# Patient Record
Sex: Female | Born: 1975 | Hispanic: No | Marital: Single | Smoking: Current every day smoker
Health system: Southern US, Community
[De-identification: ages and names within clinical notes are randomized; demographics above are authoritative.]

## PROBLEM LIST (undated history)

## (undated) DIAGNOSIS — J45909 Unspecified asthma, uncomplicated: Secondary | ICD-10-CM

## (undated) DIAGNOSIS — F4310 Post-traumatic stress disorder, unspecified: Secondary | ICD-10-CM

## (undated) DIAGNOSIS — F32A Depression, unspecified: Secondary | ICD-10-CM

## (undated) DIAGNOSIS — F329 Major depressive disorder, single episode, unspecified: Secondary | ICD-10-CM

---

## 2000-12-16 ENCOUNTER — Ambulatory Visit (HOSPITAL_COMMUNITY): Admission: RE | Admit: 2000-12-16 | Discharge: 2000-12-16 | Payer: Self-pay | Admitting: General Surgery

## 2000-12-17 ENCOUNTER — Ambulatory Visit (HOSPITAL_COMMUNITY): Admission: RE | Admit: 2000-12-17 | Discharge: 2000-12-17 | Payer: Self-pay | Admitting: General Surgery

## 2000-12-17 ENCOUNTER — Encounter: Payer: Self-pay | Admitting: General Surgery

## 2001-03-23 ENCOUNTER — Inpatient Hospital Stay (HOSPITAL_COMMUNITY): Admission: EM | Admit: 2001-03-23 | Discharge: 2001-03-25 | Payer: Self-pay | Admitting: Emergency Medicine

## 2001-03-24 ENCOUNTER — Encounter: Payer: Self-pay | Admitting: *Deleted

## 2001-11-21 ENCOUNTER — Emergency Department (HOSPITAL_COMMUNITY): Admission: EM | Admit: 2001-11-21 | Discharge: 2001-11-21 | Payer: Self-pay | Admitting: Emergency Medicine

## 2001-11-21 ENCOUNTER — Encounter: Payer: Self-pay | Admitting: Emergency Medicine

## 2003-06-12 ENCOUNTER — Emergency Department (HOSPITAL_COMMUNITY): Admission: EM | Admit: 2003-06-12 | Discharge: 2003-06-13 | Payer: Self-pay | Admitting: Emergency Medicine

## 2005-09-15 ENCOUNTER — Inpatient Hospital Stay (HOSPITAL_COMMUNITY): Admission: AD | Admit: 2005-09-15 | Discharge: 2005-09-16 | Payer: Self-pay | Admitting: Family Medicine

## 2005-10-09 ENCOUNTER — Ambulatory Visit: Payer: Self-pay | Admitting: Obstetrics and Gynecology

## 2005-10-09 ENCOUNTER — Encounter (INDEPENDENT_AMBULATORY_CARE_PROVIDER_SITE_OTHER): Payer: Self-pay | Admitting: *Deleted

## 2007-04-04 ENCOUNTER — Emergency Department (HOSPITAL_COMMUNITY): Admission: EM | Admit: 2007-04-04 | Discharge: 2007-04-04 | Payer: Self-pay | Admitting: Emergency Medicine

## 2007-04-19 ENCOUNTER — Ambulatory Visit (HOSPITAL_COMMUNITY): Admission: RE | Admit: 2007-04-19 | Discharge: 2007-04-19 | Payer: Self-pay | Admitting: Orthopedic Surgery

## 2007-04-27 ENCOUNTER — Encounter: Admission: RE | Admit: 2007-04-27 | Discharge: 2007-07-26 | Payer: Self-pay | Admitting: Orthopedic Surgery

## 2007-11-30 ENCOUNTER — Inpatient Hospital Stay (HOSPITAL_COMMUNITY): Admission: AD | Admit: 2007-11-30 | Discharge: 2007-12-01 | Payer: Self-pay | Admitting: Obstetrics and Gynecology

## 2008-01-22 ENCOUNTER — Emergency Department (HOSPITAL_COMMUNITY): Admission: EM | Admit: 2008-01-22 | Discharge: 2008-01-23 | Payer: Self-pay | Admitting: Emergency Medicine

## 2008-06-07 ENCOUNTER — Inpatient Hospital Stay (HOSPITAL_COMMUNITY): Admission: AD | Admit: 2008-06-07 | Discharge: 2008-06-07 | Payer: Self-pay | Admitting: Obstetrics & Gynecology

## 2008-09-14 ENCOUNTER — Ambulatory Visit (HOSPITAL_COMMUNITY): Admission: RE | Admit: 2008-09-14 | Discharge: 2008-09-14 | Payer: Self-pay | Admitting: Orthopedic Surgery

## 2008-09-27 ENCOUNTER — Ambulatory Visit (HOSPITAL_COMMUNITY): Admission: RE | Admit: 2008-09-27 | Discharge: 2008-09-27 | Payer: Self-pay | Admitting: Orthopedic Surgery

## 2008-11-08 IMAGING — CR DG FOREARM 2V*L*
2 series · 2 of 2 positions shown · non-contrast
Comparison: none

HISTORY: Left arm injury, pain and swelling

LEFT FOREARM 2 VIEWS:
Spiral fracture distal left ulnar diaphysis.
Fracture is mildly displaced radial and volar.
No definite radial fracture identified.
Elbow and wrist joint alignments appear normal.

[x forearm ap left]
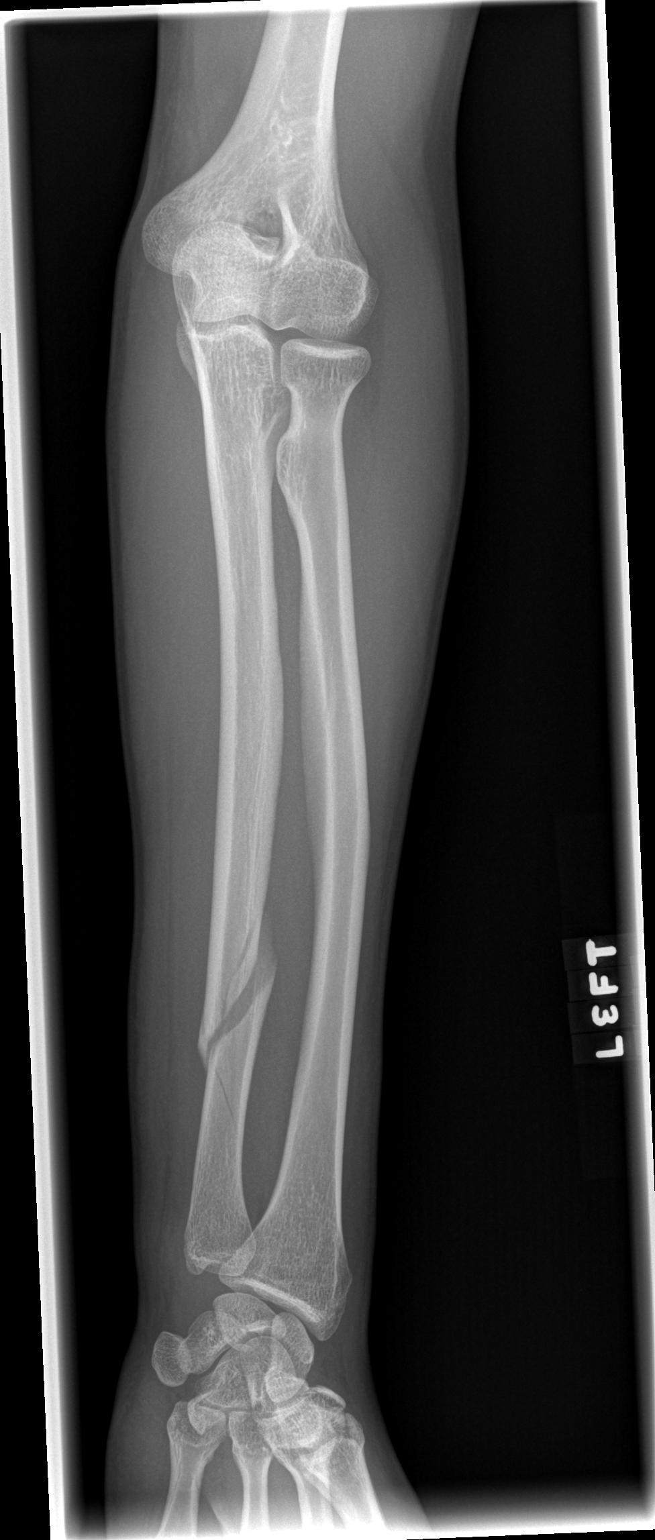

[x forearm lat left]
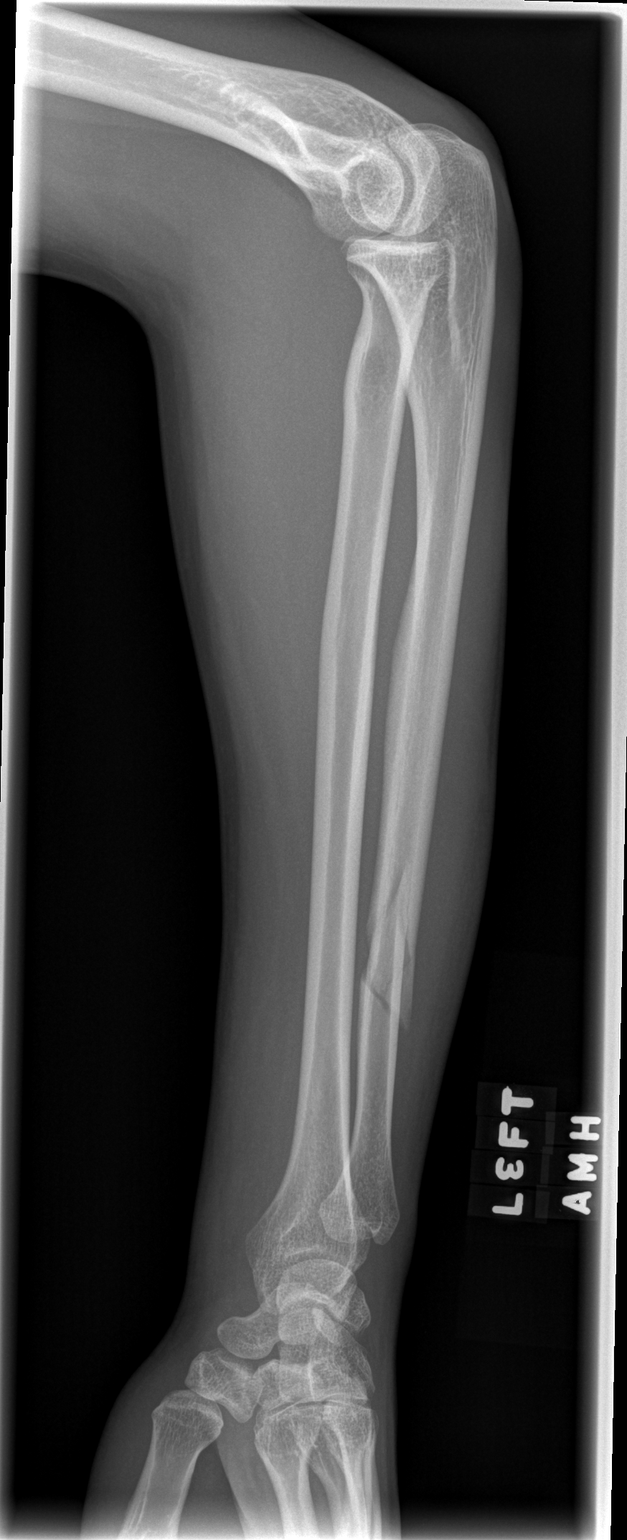

[2 of 2 positions shown; findings below may reference images not displayed]

IMPRESSION: Mildly displaced spiral fracture distal left ulnar diaphysis.

## 2009-01-29 ENCOUNTER — Emergency Department (HOSPITAL_COMMUNITY): Admission: EM | Admit: 2009-01-29 | Discharge: 2009-01-29 | Payer: Self-pay | Admitting: Emergency Medicine

## 2009-10-10 ENCOUNTER — Emergency Department (HOSPITAL_COMMUNITY): Admission: EM | Admit: 2009-10-10 | Discharge: 2009-10-10 | Payer: Self-pay | Admitting: Emergency Medicine

## 2009-12-21 ENCOUNTER — Emergency Department (HOSPITAL_COMMUNITY): Admission: EM | Admit: 2009-12-21 | Discharge: 2009-12-21 | Payer: Self-pay | Admitting: Emergency Medicine

## 2010-01-05 ENCOUNTER — Emergency Department (HOSPITAL_COMMUNITY): Admission: EM | Admit: 2010-01-05 | Discharge: 2010-01-05 | Payer: Self-pay | Admitting: Emergency Medicine

## 2010-05-03 ENCOUNTER — Emergency Department (HOSPITAL_COMMUNITY): Admission: EM | Admit: 2010-05-03 | Discharge: 2010-05-03 | Payer: Self-pay | Admitting: Emergency Medicine

## 2010-10-10 ENCOUNTER — Emergency Department (HOSPITAL_COMMUNITY)
Admission: EM | Admit: 2010-10-10 | Discharge: 2010-10-10 | Disposition: A | Discharge status: Home / Self Care | Payer: Self-pay | Attending: Emergency Medicine | Admitting: Emergency Medicine

## 2010-10-10 DIAGNOSIS — N898 Other specified noninflammatory disorders of vagina: Secondary | ICD-10-CM | POA: Insufficient documentation

## 2010-10-11 LAB — GC/CHLAMYDIA PROBE AMP, GENITAL
Chlamydia, DNA Probe: NEGATIVE
GC Probe Amp, Genital: NEGATIVE

## 2010-11-21 LAB — URINALYSIS, ROUTINE W REFLEX MICROSCOPIC
Ketones, ur: NEGATIVE mg/dL
Urobilinogen, UA: 0.2 mg/dL (ref 0.0–1.0)
pH: 5.5 (ref 5.0–8.0)

## 2010-11-21 LAB — URINE CULTURE
Colony Count: 4000
Culture  Setup Time: 201108270045

## 2010-11-21 LAB — GC/CHLAMYDIA PROBE AMP, GENITAL: Chlamydia, DNA Probe: POSITIVE — AB

## 2010-12-17 LAB — DIFFERENTIAL
Basophils Absolute: 0 10*3/uL (ref 0.0–0.1)
Basophils Relative: 0 % (ref 0–1)
Eosinophils Absolute: 0 10*3/uL (ref 0.0–0.7)
Eosinophils Relative: 0 % (ref 0–5)
Lymphocytes Relative: 16 % (ref 12–46)
Lymphs Abs: 1.8 10*3/uL (ref 0.7–4.0)
Monocytes Absolute: 0.7 10*3/uL (ref 0.1–1.0)
Monocytes Relative: 6 % (ref 3–12)
Neutro Abs: 8.6 10*3/uL — ABNORMAL HIGH (ref 1.7–7.7)
Neutrophils Relative %: 77 % (ref 43–77)

## 2010-12-17 LAB — CBC
HCT: 38.7 % (ref 36.0–46.0)
Hemoglobin: 13.4 g/dL (ref 12.0–15.0)
MCHC: 34.7 g/dL (ref 30.0–36.0)
MCV: 103.7 fL — ABNORMAL HIGH (ref 78.0–100.0)
Platelets: 219 10*3/uL (ref 150–400)
RBC: 3.73 MIL/uL — ABNORMAL LOW (ref 3.87–5.11)
RDW: 13.6 % (ref 11.5–15.5)
WBC: 11.2 10*3/uL — ABNORMAL HIGH (ref 4.0–10.5)

## 2010-12-17 LAB — BASIC METABOLIC PANEL
BUN: 2 mg/dL — ABNORMAL LOW (ref 6–23)
CO2: 25 mEq/L (ref 19–32)
Calcium: 9.1 mg/dL (ref 8.4–10.5)
Chloride: 104 mEq/L (ref 96–112)
Creatinine, Ser: 0.65 mg/dL (ref 0.4–1.2)
Potassium: 3.1 mEq/L — ABNORMAL LOW (ref 3.5–5.1)
Sodium: 138 mEq/L (ref 135–145)

## 2010-12-17 LAB — RAPID URINE DRUG SCREEN, HOSP PERFORMED
Amphetamines: POSITIVE — AB
Barbiturates: NOT DETECTED
Benzodiazepines: NOT DETECTED
Cocaine: POSITIVE — AB
Opiates: NOT DETECTED
Tetrahydrocannabinol: POSITIVE — AB

## 2010-12-17 LAB — MAGNESIUM: Magnesium: 2.1 mg/dL (ref 1.5–2.5)

## 2010-12-23 LAB — CBC
HCT: 42 % (ref 36.0–46.0)
HCT: 42.8 % (ref 36.0–46.0)
Hemoglobin: 14 g/dL (ref 12.0–15.0)
Hemoglobin: 14.4 g/dL (ref 12.0–15.0)
MCHC: 33.3 g/dL (ref 30.0–36.0)
MCHC: 33.7 g/dL (ref 30.0–36.0)
MCV: 104 fL — ABNORMAL HIGH (ref 78.0–100.0)
MCV: 103.3 fL — ABNORMAL HIGH (ref 78.0–100.0)
Platelets: 214 10*3/uL (ref 150–400)
Platelets: 214 10*3/uL (ref 150–400)
RBC: 4.04 MIL/uL (ref 3.87–5.11)
RBC: 4.15 MIL/uL (ref 3.87–5.11)
RDW: 13.8 % (ref 11.5–15.5)
RDW: 13.5 % (ref 11.5–15.5)
WBC: 9.6 10*3/uL (ref 4.0–10.5)
WBC: 8.5 10*3/uL (ref 4.0–10.5)

## 2011-01-08 ENCOUNTER — Emergency Department (HOSPITAL_COMMUNITY)
Admission: EM | Admit: 2011-01-08 | Discharge: 2011-01-08 | Disposition: A | Discharge status: Home / Self Care | Payer: Self-pay | Attending: Emergency Medicine | Admitting: Emergency Medicine

## 2011-01-08 DIAGNOSIS — Z049 Encounter for examination and observation for unspecified reason: Secondary | ICD-10-CM | POA: Insufficient documentation

## 2011-01-08 DIAGNOSIS — N898 Other specified noninflammatory disorders of vagina: Secondary | ICD-10-CM | POA: Insufficient documentation

## 2011-01-08 DIAGNOSIS — N949 Unspecified condition associated with female genital organs and menstrual cycle: Secondary | ICD-10-CM | POA: Insufficient documentation

## 2011-01-08 LAB — URINALYSIS, ROUTINE W REFLEX MICROSCOPIC
Bilirubin Urine: NEGATIVE
Glucose, UA: NEGATIVE mg/dL
Hgb urine dipstick: NEGATIVE
Nitrite: NEGATIVE
Specific Gravity, Urine: 1.024 (ref 1.005–1.030)
Urobilinogen, UA: 1 mg/dL (ref 0.0–1.0)

## 2011-01-08 LAB — WET PREP, GENITAL
WBC, Wet Prep HPF POC: NONE SEEN
Yeast Wet Prep HPF POC: NONE SEEN

## 2011-01-09 LAB — GC/CHLAMYDIA PROBE AMP, GENITAL: GC Probe Amp, Genital: NEGATIVE

## 2011-01-21 NOTE — Op Note (Signed)
Courtney Hansen, Courtney Hansen                 ACCOUNT NO.:  [id number]   MEDICAL RECORD NO.:  [id number]          PATIENT TYPE:  AMB   LOCATION:  SDS                          FACILITY:  [facility]   PHYSICIAN:  Artist Pais. Weingold, M.D.DATE OF BIRTH:  [birth year]   DATE OF PROCEDURE:  09/27/2008  DATE OF DISCHARGE:  09/27/2008                               OPERATIVE REPORT   PREOPERATIVE DIAGNOSIS:  Retained hardware, left ulna.   POSTOPERATIVE DIAGNOSIS:  Retained hardware, left ulna.   PROCEDURE:  Hardware removal, left ulna.   SURGEON:  Artist Pais. Mina Marble, MD   ASSISTANT:  None.   ANESTHESIA:  General.   TOURNIQUET TIME:  18 minutes.   COMPLICATIONS:  None.   DRAINS:  None.   OPERATIVE REPORT:  The patient taken to the operating suite.  After  induction of adequate general anesthesia, left upper extremity was  prepped and draped in sterile fashion.  An Esmarch was used to  exsanguinate the limb.  Tourniquet was then inflated to 250 mmHg.  Using  a previous incision on the subcutaneous for the ulna, mid to distal  third skin was incised for 6-8 cm.  Dissection was carried down to  interval between the ECU and FCU.  Dissection was carried down to the  six-hole compression plate.  This was carefully removed.  The wound was  irrigated, debrided of excess bone around the screw hole margins and  then loosely closed with 3-0 Prolene subcuticular stitch.  Steri-Strips,  4x4s fluffs, and a compressive dressing was applied as well as an ulnar  gutter splint.  The patient tolerated procedure well went to recovery  room in stable fashion.      Artist Pais Mina Marble, M.D.  Electronically Signed     MAW/MEDQ  D:  09/27/2008  T:  09/27/2008  Job:  [id number]

## 2011-01-21 NOTE — Op Note (Signed)
[name], Courtney Hansen                 ACCOUNT NO.:  [id number]   MEDICAL RECORD NO.:  [id number]          PATIENT TYPE:  AMB   LOCATION:  SDS                          FACILITY:  [facility]   PHYSICIAN:  Artist Pais. Weingold, M.D.DATE OF BIRTH:  [birth year]   DATE OF PROCEDURE:  04/19/2007  DATE OF DISCHARGE:                               OPERATIVE REPORT   PREOPERATIVE DIAGNOSIS:  Displaced left distal third ulna fracture.   POSTOPERATIVE DIAGNOSIS:  Displaced left distal third ulna fracture.   PROCEDURE:  Open reduction internal fixation of above using two 2.5-mm  lag screws followed by a 6-hole compression plate.   SURGEON:  Artist Pais. Mina Marble, M.D.   ASSISTANT:  None.   ANESTHESIA:  General.   TOURNIQUET TIME:  45 minutes.   COMPLICATIONS:  No complications.   DRAINS:  No drains.   OPERATIVE REPORT:  The patient was taken to the operating suite where  after the induction of adequate general anesthesia the left upper  extremity was prepped and draped in sterile fashion.  An Esmarch was  used to exsanguinate limb.  Tourniquet was then inflated to 250 mmHg.  At this point in time a longitudinal incision was made over the  subcutaneous border of the ulna, left side distal to mid third.  Skin  was incised.  The interval between the ECU and FCU was developed.  Dissection was carried down to the fracture site.  The fracture site was  debrided of clot.  Reduction was performed with 2 reduction clamps.  Intraoperative fluoroscopy revealed near anatomic reduction on both the  AP lateral and oblique view.  Two 2.5-mm lag screws were placed through  the large butterfly fragment and into the main shaft fragment securing  the fracture fixation.  A 6-hole compression plate was then used in  buttress fashion to neutralize and stabilize the plate using an  additional 4 cortical screws above and below the fracture site.  Intraoperative x-rays showed adequate reduction on both the AP lateral  and  oblique view.  The wound was irrigated and closed in layers with 2-0  Vicryl and a 3-0 Prolene subcuticular stitch on the skin.  Steri-Strips,  4x4s, Fluffs and an ulnar gutter splint were applied.  The patient  tolerated the procedure well.      Artist Pais Mina Marble, M.D.  Electronically Signed    MAW/MEDQ  D:  04/19/2007  T:  04/19/2007  Job:  [id number]

## 2011-01-24 NOTE — Group Therapy Note (Signed)
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  She went in to the [facility] recently for  a month episode of dysfunctional uterine bleeding and was placed on Ortho  Tri-Cyclen, which seemed to work satisfactorily.  She just came off her  first pack and has not had a Pap smear in over a year, so that will be done.  In addition to that, she is in good health with no significant complaints  and her hematocrit was 37+ at the [facility]U when she was there a few days ago.   PHYSICAL GENITAL EXAMINATION:  The abdomen is soft, flat, nontender, no  masses and no organomegaly.  External genitalia normal.  BUS within normal  limits.  The vagina is clean with a moderate amount of blood in the vault.  The cervix is clean and parous.  The uterus is anterior and normal size,  shape and consistency, and the adnexa is normal.   A Pap smear was taken.  The patient was given six samples of Ortho Tri-  Cyclen Lo and we have discussed with her the possibility that she may want  to consider a Mirena IUD, which she is going to consider for the future.           ______________________________  Argentina Donovan, MD     PR/MEDQ  D:  10/09/2005  T:  10/09/2005  Job:  [id number]

## 2011-04-19 ENCOUNTER — Emergency Department (HOSPITAL_COMMUNITY)
Admission: EM | Admit: 2011-04-19 | Discharge: 2011-04-19 | Disposition: A | Discharge status: Home / Self Care | Payer: Self-pay | Attending: Emergency Medicine | Admitting: Emergency Medicine

## 2011-04-19 ENCOUNTER — Emergency Department (HOSPITAL_COMMUNITY): Payer: Self-pay

## 2011-04-19 DIAGNOSIS — R220 Localized swelling, mass and lump, head: Secondary | ICD-10-CM | POA: Insufficient documentation

## 2011-04-19 DIAGNOSIS — IMO0002 Reserved for concepts with insufficient information to code with codable children: Secondary | ICD-10-CM | POA: Insufficient documentation

## 2011-06-02 LAB — CBC
RBC: 4.12
WBC: 11.7 — ABNORMAL HIGH

## 2011-06-02 LAB — WET PREP, GENITAL: Trich, Wet Prep: NONE SEEN

## 2011-06-09 LAB — URINE MICROSCOPIC-ADD ON

## 2011-06-09 LAB — WET PREP, GENITAL: Trich, Wet Prep: NONE SEEN

## 2011-06-09 LAB — POCT PREGNANCY, URINE: Preg Test, Ur: NEGATIVE

## 2011-06-09 LAB — URINALYSIS, ROUTINE W REFLEX MICROSCOPIC
Glucose, UA: NEGATIVE
Leukocytes, UA: NEGATIVE
pH: 5.5

## 2011-06-23 LAB — CBC
HCT: 40.9
Hemoglobin: 14
MCHC: 34.2
MCV: 103.2 — ABNORMAL HIGH
Platelets: 245
RBC: 3.97
RDW: 13.3
WBC: 8.6

## 2011-08-20 ENCOUNTER — Emergency Department (HOSPITAL_COMMUNITY)
Admission: EM | Admit: 2011-08-20 | Discharge: 2011-08-21 | Discharge status: Against Medical Advice | Payer: Self-pay | Attending: Emergency Medicine | Admitting: Emergency Medicine

## 2011-08-20 ENCOUNTER — Encounter: Payer: Self-pay | Admitting: *Deleted

## 2011-08-20 DIAGNOSIS — N898 Other specified noninflammatory disorders of vagina: Secondary | ICD-10-CM | POA: Insufficient documentation

## 2011-08-20 DIAGNOSIS — N949 Unspecified condition associated with female genital organs and menstrual cycle: Secondary | ICD-10-CM | POA: Insufficient documentation

## 2011-08-20 NOTE — ED Notes (Signed)
Pt in c/o pelvic pain and vaginal discharge, states she has been dealing with vaginal infection for last 6 months and has been treated and reinfected, states her partner will not get antibiotic

## 2015-05-16 ENCOUNTER — Emergency Department (HOSPITAL_COMMUNITY)
Admission: EM | Admit: 2015-05-16 | Discharge: 2015-05-16 | Disposition: A | Discharge status: Home / Self Care | Payer: Self-pay | Attending: Emergency Medicine | Admitting: Emergency Medicine

## 2015-05-16 ENCOUNTER — Emergency Department (HOSPITAL_COMMUNITY): Payer: Self-pay

## 2015-05-16 ENCOUNTER — Encounter (HOSPITAL_COMMUNITY): Payer: Self-pay | Admitting: *Deleted

## 2015-05-16 DIAGNOSIS — J40 Bronchitis, not specified as acute or chronic: Secondary | ICD-10-CM

## 2015-05-16 DIAGNOSIS — J209 Acute bronchitis, unspecified: Secondary | ICD-10-CM | POA: Insufficient documentation

## 2015-05-16 DIAGNOSIS — Z720 Tobacco use: Secondary | ICD-10-CM | POA: Insufficient documentation

## 2015-05-16 LAB — I-STAT TROPONIN, ED: TROPONIN I, POC: 0 ng/mL (ref 0.00–0.08)

## 2015-05-16 LAB — CBC
HEMATOCRIT: 38 % (ref 36.0–46.0)
Hemoglobin: 12.8 g/dL (ref 12.0–15.0)
MCH: 34 pg (ref 26.0–34.0)
MCHC: 33.7 g/dL (ref 30.0–36.0)
MCV: 100.8 fL — ABNORMAL HIGH (ref 78.0–100.0)
Platelets: 244 10*3/uL (ref 150–400)
RBC: 3.77 MIL/uL — ABNORMAL LOW (ref 3.87–5.11)
RDW: 13.3 % (ref 11.5–15.5)
WBC: 6.8 10*3/uL (ref 4.0–10.5)

## 2015-05-16 LAB — BASIC METABOLIC PANEL
Anion gap: 8 (ref 5–15)
BUN: 9 mg/dL (ref 6–20)
CALCIUM: 9.1 mg/dL (ref 8.9–10.3)
CO2: 28 mmol/L (ref 22–32)
Chloride: 101 mmol/L (ref 101–111)
Creatinine, Ser: 0.61 mg/dL (ref 0.44–1.00)
GFR calc Af Amer: >60 mL/min (ref >60)
GLUCOSE: 102 mg/dL — AB (ref 65–99)
Potassium: 4.2 mmol/L (ref 3.5–5.1)
Sodium: 137 mmol/L (ref 135–145)

## 2015-05-16 MED ORDER — IPRATROPIUM BROMIDE 0.02 % IN SOLN
0.5000 mg | Freq: Once | RESPIRATORY_TRACT | Status: AC
  Administered 2015-05-16: 0.5 mg via RESPIRATORY_TRACT
  Filled 2015-05-16: qty 2.5

## 2015-05-16 MED ORDER — AZITHROMYCIN 250 MG PO TABS: 250.0000 mg | ORAL_TABLET | Freq: Every day | ORAL | Status: DC

## 2015-05-16 MED ORDER — PREDNISONE 20 MG PO TABS: 40.0000 mg | ORAL_TABLET | Freq: Every day | ORAL | Status: DC

## 2015-05-16 MED ORDER — ALBUTEROL SULFATE (2.5 MG/3ML) 0.083% IN NEBU
5.0000 mg | INHALATION_SOLUTION | Freq: Once | RESPIRATORY_TRACT | Status: AC
  Administered 2015-05-16: 5 mg via RESPIRATORY_TRACT
  Filled 2015-05-16: qty 6

## 2015-05-16 MED ORDER — IPRATROPIUM BROMIDE 0.02 % IN SOLN
0.5000 mg | Freq: Once | RESPIRATORY_TRACT | Status: AC
Start: 2015-05-16 — End: 2015-05-16
  Administered 2015-05-16: 0.5 mg via RESPIRATORY_TRACT
  Filled 2015-05-16: qty 2.5

## 2015-05-16 MED ORDER — GUAIFENESIN-DM 100-10 MG/5ML PO SYRP: 5.0000 mL | ORAL_SOLUTION | ORAL | Status: DC | PRN

## 2015-05-16 MED ORDER — PREDNISONE 20 MG PO TABS
60.0000 mg | ORAL_TABLET | Freq: Once | ORAL | Status: AC
  Administered 2015-05-16: 60 mg via ORAL
  Filled 2015-05-16: qty 3

## 2015-05-16 MED ORDER — ALBUTEROL SULFATE HFA 108 (90 BASE) MCG/ACT IN AERS
2.0000 | INHALATION_SPRAY | Freq: Once | RESPIRATORY_TRACT | Status: AC
  Administered 2015-05-16: 2 via RESPIRATORY_TRACT
  Filled 2015-05-16: qty 6.7

## 2015-05-16 NOTE — Discharge Instructions (Signed)
How to Use an Inhaler Proper inhaler technique is very important. Good technique ensures that the medicine reaches the lungs. Poor technique results in depositing the medicine on the tongue and back of the throat rather than in the airways. If you do not use the inhaler with good technique, the medicine will not help you. STEPS TO FOLLOW IF USING AN INHALER WITHOUT AN EXTENSION TUBE 1. Remove the cap from the inhaler. 2. If you are using the inhaler for the first time, you will need to prime it. Shake the inhaler for 5 seconds and release four puffs into the air, away from your face. Ask your health care provider or pharmacist if you have questions about priming your inhaler. 3. Shake the inhaler for 5 seconds before each breath in (inhalation). 4. Position the inhaler so that the top of the canister faces up. 5. Put your index finger on the top of the medicine canister. Your thumb supports the bottom of the inhaler. 6. Open your mouth. 7. Either place the inhaler between your teeth and place your lips tightly around the mouthpiece, or hold the inhaler 1-2 inches away from your open mouth. If you are unsure of which technique to use, ask your health care provider. 8. Breathe out (exhale) normally and as completely as possible. 9. Press the canister down with your index finger to release the medicine. 10. At the same time as the canister is pressed, inhale deeply and slowly until your lungs are completely filled. This should take 4-6 seconds. Keep your tongue down. 11. Hold the medicine in your lungs for 5-10 seconds (10 seconds is best). This helps the medicine get into the small airways of your lungs. 12. Breathe out slowly, through pursed lips. Whistling is an example of pursed lips. 13. Wait at least 15-30 seconds between puffs. Continue with the above steps until you have taken the number of puffs your health care provider has ordered. Do not use the inhaler more than your health care provider  tells you. 14. Replace the cap on the inhaler. 15. Follow the directions from your health care provider or the inhaler insert for cleaning the inhaler. STEPS TO FOLLOW IF USING AN INHALER WITH AN EXTENSION (SPACER) 1. Remove the cap from the inhaler. 2. If you are using the inhaler for the first time, you will need to prime it. Shake the inhaler for 5 seconds and release four puffs into the air, away from your face. Ask your health care provider or pharmacist if you have questions about priming your inhaler. 3. Shake the inhaler for 5 seconds before each breath in (inhalation). 4. Place the open end of the spacer onto the mouthpiece of the inhaler. 5. Position the inhaler so that the top of the canister faces up and the spacer mouthpiece faces you. 6. Put your index finger on the top of the medicine canister. Your thumb supports the bottom of the inhaler and the spacer. 7. Breathe out (exhale) normally and as completely as possible. 8. Immediately after exhaling, place the spacer between your teeth and into your mouth. Close your lips tightly around the spacer. 9. Press the canister down with your index finger to release the medicine. 10. At the same time as the canister is pressed, inhale deeply and slowly until your lungs are completely filled. This should take 4-6 seconds. Keep your tongue down and out of the way. 11. Hold the medicine in your lungs for 5-10 seconds (10 seconds is best). This helps the   medicine get into the small airways of your lungs. Exhale. 12. Repeat inhaling deeply through the spacer mouthpiece. Again hold that breath for up to 10 seconds (10 seconds is best). Exhale slowly. If it is difficult to take this second deep breath through the spacer, breathe normally several times through the spacer. Remove the spacer from your mouth. 13. Wait at least 15-30 seconds between puffs. Continue with the above steps until you have taken the number of puffs your health care provider has  ordered. Do not use the inhaler more than your health care provider tells you. 14. Remove the spacer from the inhaler, and place the cap on the inhaler. 15. Follow the directions from your health care provider or the inhaler insert for cleaning the inhaler and spacer. If you are using different kinds of inhalers, use your quick relief medicine to open the airways 10-15 minutes before using a steroid if instructed to do so by your health care provider. If you are unsure which inhalers to use and the order of using them, ask your health care provider, nurse, or respiratory therapist. If you are using a steroid inhaler, always rinse your mouth with water after your last puff, then gargle and spit out the water. Do not swallow the water. AVOID:  Inhaling before or after starting the spray of medicine. It takes practice to coordinate your breathing with triggering the spray.  Inhaling through the nose (rather than the mouth) when triggering the spray. HOW TO DETERMINE IF YOUR INHALER IS FULL OR NEARLY EMPTY You cannot know when an inhaler is empty by shaking it. A few inhalers are now being made with dose counters. Ask your health care provider for a prescription that has a dose counter if you feel you need that extra help. If your inhaler does not have a counter, ask your health care provider to help you determine the date you need to refill your inhaler. Write the refill date on a calendar or your inhaler canister. Refill your inhaler 7-10 days before it runs out. Be sure to keep an adequate supply of medicine. This includes making sure it is not expired, and that you have a spare inhaler.  SEEK MEDICAL CARE IF:   Your symptoms are only partially relieved with your inhaler.  You are having trouble using your inhaler.  You have some increase in phlegm. SEEK IMMEDIATE MEDICAL CARE IF:   You feel little or no relief with your inhalers. You are still wheezing and are feeling shortness of breath or  tightness in your chest or both.  You have dizziness, headaches, or a fast heart rate.  You have chills, fever, or night sweats.  You have a noticeable increase in phlegm production, or there is blood in the phlegm. MAKE SURE YOU:   Understand these instructions.  Will watch your condition.  Will get help right away if you are not doing well or get worse. Document Released: 08/22/2000 Document Revised: 06/15/2013 Document Reviewed: 03/24/2013 Exit[employer] Patient Information 2015 [employer]. This information is not intended to replace advice given to you by your health care provider. Make sure you discuss any questions you have with your health care provider.  

## 2015-05-16 NOTE — ED Notes (Signed)
Pt reports non-radiating mid-sternal cp with cough.  States it feels tight and "inflammed" with burning sensation.

## 2015-05-16 NOTE — ED Provider Notes (Addendum)
CSN: [id number]     Arrival date & time 05/16/15  1734 History   First MD Initiated Contact with Patient 05/16/15 2007     Chief Complaint  Patient presents with  . Chest Pain     (Consider location/radiation/quality/duration/timing/severity/associated sxs/prior Treatment) HPI   Patient is a 39 year old female, current smoker, who presents to the ER with approximate 4 days of chest tightness, located in her central chest, worse with palpation, and associated with nonproductive cough which has been worse at night, waking her up.  It has gradually increased in the past 4 days, she has not been able to smoke as much as usual because of the tightness. It feels like a chest cold although she does not had any URI symptoms, but she does complain of some mild throat discomfort which she believes is from coughing. She reports a childhood history of asthma and seasonal allergies. She denies any chest pain, palpitations, LE edema, orthopnea, wheeze, productive sputum, lower extremity edema, fever, chills, night sweats, recent travel.  History reviewed. No pertinent past medical history. History reviewed. No pertinent past surgical history. History reviewed. No pertinent family history. Social History  Substance Use Topics  . Smoking status: Current Every Day Smoker    Types: Cigarettes  . Smokeless tobacco: None  . Alcohol Use: Yes   OB History    No data available     Review of Systems  Constitutional: Negative.  Negative for fever, chills, diaphoresis and fatigue.  HENT: Negative for congestion, ear pain, postnasal drip, rhinorrhea, sinus pressure and sneezing.   Eyes: Negative.   Respiratory: Negative for choking, wheezing and stridor.   Cardiovascular: Negative for palpitations and leg swelling.  Gastrointestinal: Negative for nausea, vomiting, abdominal pain and diarrhea.  Endocrine: Negative.   Genitourinary: Negative.   Musculoskeletal: Negative.  Negative for back pain.  Skin:  Negative.  Negative for rash.  Neurological: Negative.  Negative for tremors, syncope, weakness and light-headedness.  Psychiatric/Behavioral: Negative.     Allergies  Review of patient's allergies indicates no known allergies.  Home Medications   Prior to Admission medications   Medication Sig Start Date End Date Taking? Authorizing Provider  guaiFENesin (ROBITUSSIN) 100 MG/5ML SOLN Take 5 mLs by mouth every 4 (four) hours as needed for cough or to loosen phlegm.   Yes Historical Provider, MD  ibuprofen (ADVIL,MOTRIN) 200 MG tablet Take 200 mg by mouth every 6 (six) hours as needed for headache or moderate pain.   Yes Historical Provider, MD  azithromycin (ZITHROMAX) 250 MG tablet Take 1 tablet (250 mg total) by mouth daily. Take first 2 tablets together, then 1 every day until finished. 05/16/15   Danelle Berry, PA-C  guaiFENesin-dextromethorphan (ROBITUSSIN DM) 100-10 MG/5ML syrup Take 5 mLs by mouth every 4 (four) hours as needed for cough. 05/16/15   Danelle Berry, PA-C  predniSONE (DELTASONE) 20 MG tablet Take 2 tablets (40 mg total) by mouth daily. 05/16/15   Danelle Berry, PA-C   BP 138/73 mmHg  Pulse 71  Temp(Src) 98.5 F (36.9 C) (Oral)  Resp 18  SpO2 98%  LMP 05/09/2015 Physical Exam  Constitutional: She is oriented to person, place, and time. She appears well-developed and well-nourished. No distress.  HENT:  Head: Normocephalic and atraumatic.  Right Ear: External ear normal.  Left Ear: External ear normal.  Nose: Nose normal.  Mouth/Throat: No oropharyngeal exudate.  Oropharynx with diffuse edema, no tonsillary exudate  Eyes: Conjunctivae and EOM are normal. Pupils are equal, round, and reactive to  light. Right eye exhibits no discharge. Left eye exhibits no discharge. No scleral icterus.  Neck: Normal range of motion. No JVD present. No tracheal deviation present. No thyromegaly present.  Cardiovascular: Normal rate, regular rhythm, normal heart sounds and intact distal pulses.   Exam reveals no gallop and no friction rub.   No murmur heard. Pulses:      Radial pulses are 2+ on the right side, and 2+ on the left side.       Dorsalis pedis pulses are 2+ on the right side, and 2+ on the left side.  Regular rate and rhythm, no murmur, rub, gallop, peripheral pulses symmetrical 2+-radial, posterior tibialis, no pitting edema  Pulmonary/Chest: Effort normal and breath sounds normal. No accessory muscle usage. No tachypnea. No respiratory distress. She has no decreased breath sounds. She has no wheezes. She has no rhonchi. She has no rales. She exhibits tenderness.  Tenderness to palpation over anterior chest, no respiratory distress, patient is speaking in full complete sentences, no accessory muscle use or tachypnea, prolonged expiration but no audible wheeze, frequent cough, no rhonchi no rales, no clubbing or cyanosis  Abdominal: Soft. Bowel sounds are normal. She exhibits no distension and no mass. There is no tenderness. There is no rebound and no guarding.  Musculoskeletal: Normal range of motion. She exhibits no edema or tenderness.  Lymphadenopathy:       Head (right side): No submandibular and no tonsillar adenopathy present.       Head (left side): No submandibular and no tonsillar adenopathy present.    She has no cervical adenopathy.       Right cervical: No superficial cervical and no deep cervical adenopathy present.      Left cervical: No superficial cervical and no deep cervical adenopathy present.  Neurological: She is alert and oriented to person, place, and time. She has normal reflexes. No cranial nerve deficit. She exhibits normal muscle tone. Coordination normal.  Skin: Skin is warm and dry. No rash noted. She is not diaphoretic. No erythema. No pallor.  Psychiatric: She has a normal mood and affect. Her behavior is normal. Judgment and thought content normal.  Nursing note and vitals reviewed.   ED Course  Procedures (including critical care  time) Labs Review Labs Reviewed  BASIC METABOLIC PANEL - Abnormal; Notable for the following:    Glucose, Bld 102 (*)    All other components within normal limits  CBC - Abnormal; Notable for the following:    RBC 3.77 (*)    MCV 100.8 (*)    All other components within normal limits  I-STAT TROPOININ, ED    Imaging Review Dg Chest 2 View  05/16/2015   CLINICAL DATA:  Mid chest pain and shortness of breath since 05/13/2015.  EXAM: CHEST  2 VIEW  COMPARISON:  None.  FINDINGS: Heart size and mediastinal contours are within normal limits. Both lungs are clear. Visualized skeletal structures are unremarkable.  IMPRESSION: Negative exam.   Electronically Signed   By: Drusilla Kanner M.D.   On: 05/16/2015 18:30   I have personally reviewed and evaluated these images and lab results as part of my medical decision-making.   EKG Interpretation   Date/Time:  Wednesday May 16 2015 17:54:47 EDT Ventricular Rate:  57 PR Interval:  56 QRS Duration: 82 QT Interval:  458 QTC Calculation: 446 R Axis:   72 Text Interpretation:  Sinus rhythm Short PR interval Baseline wander in  lead(s) I III aVL V1 No significant change  since last tracing Confirmed by  Jillian Pianka MD, Itzabella Sorrels [phone]) on 05/16/2015 8:32:54 PM      MDM   Final diagnoses:  Bronchitis    Patient 39 year old female presents to the ER with a chief complaint of chest pain, a cardiac workup was initiated Upon my exam patient had negative results for troponin, chest x-ray, EKG, labs unremarkable History suggestive of a pulmonary source of chest discomfort The patient will be given a breathing treatment and reevaluated for response  Pt feels minimal improvement with her chest tightness, breathsounds unchanged Repeat duoneb add oral prednisone  Pt will be given albuterol inhaler in ED, rx for zpack, prednisone burst and cough syrup - suspect this is an acute bronchitis vs bronchospasm, given her asthma hx  Pt new to the area, given  info for [facility] for f/up  Pt felt improvement of her chest tightness after breathing treatments.  Will d/c home with abx, albuterol inhaler (given in ED) prednisone burst x 5 d, and cough syrup. Return precautions given. Smoking cessation discussed for 5+ minutes.  Patient was discharged in satisfactory and stable condition, with [facility] Medications  albuterol (PROVENTIL) (2.5 MG/3ML) 0.083% nebulizer solution 5 mg (5 mg Nebulization Given 05/16/15 2029)  ipratropium (ATROVENT) nebulizer solution 0.5 mg (0.5 mg Nebulization Given 05/16/15 2030)  albuterol (PROVENTIL) (2.5 MG/3ML) 0.083% nebulizer solution 5 mg (5 mg Nebulization Given 05/16/15 2138)  ipratropium (ATROVENT) nebulizer solution 0.5 mg (0.5 mg Nebulization Given 05/16/15 2139)  predniSONE (DELTASONE) tablet 60 mg (60 mg Oral Given 05/16/15 2136)  albuterol (PROVENTIL HFA;VENTOLIN HFA) 108 (90 BASE) MCG/ACT inhaler 2 puff (2 puffs Inhalation Given 05/16/15 2138)   Filed Vitals:   05/16/15 1741 05/16/15 2058 05/16/15 2142 05/16/15 2204  BP: 131/86 127/76  138/73  Pulse: 63 90  71  Temp: 98.6 F (37 C)   98.5 F (36.9 C)  TempSrc: Oral   Oral  Resp: 20 20  18   SpO2: 100% 95% 100% 98%        Danelle Berry, PA-C 05/17/15 6045   Medical screening examination/treatment/procedure(s) were performed by non-physician practitioner and as supervising physician I was immediately available for consultation/collaboration.   EKG Interpretation   Date/Time:  Wednesday May 16 2015 17:54:47 EDT Ventricular Rate:  57 PR Interval:  56 QRS Duration: 82 QT Interval:  458 QTC Calculation: 446 R Axis:   72 Text Interpretation:  Sinus rhythm Short PR interval Baseline wander in  lead(s) I III aVL V1 No significant change since last tracing Confirmed by  Avrielle Fry MD, Ashish Rossetti [phone]) on 05/16/2015 8:32:54 PM         Lavera Guise, MD 05/17/15 1912  Lavera Guise, MD 05/17/15 [phone]

## 2015-05-17 ENCOUNTER — Encounter (HOSPITAL_COMMUNITY): Payer: Self-pay | Admitting: Emergency Medicine

## 2015-07-30 ENCOUNTER — Emergency Department (HOSPITAL_COMMUNITY)
Admission: EM | Admit: 2015-07-30 | Discharge: 2015-07-30 | Disposition: A | Discharge status: Home / Self Care | Payer: Self-pay | Attending: Emergency Medicine | Admitting: Emergency Medicine

## 2015-07-30 ENCOUNTER — Encounter (HOSPITAL_COMMUNITY): Payer: Self-pay | Admitting: *Deleted

## 2015-07-30 DIAGNOSIS — Z3202 Encounter for pregnancy test, result negative: Secondary | ICD-10-CM | POA: Insufficient documentation

## 2015-07-30 DIAGNOSIS — F1010 Alcohol abuse, uncomplicated: Secondary | ICD-10-CM | POA: Insufficient documentation

## 2015-07-30 DIAGNOSIS — F17210 Nicotine dependence, cigarettes, uncomplicated: Secondary | ICD-10-CM | POA: Insufficient documentation

## 2015-07-30 DIAGNOSIS — R531 Weakness: Secondary | ICD-10-CM | POA: Insufficient documentation

## 2015-07-30 DIAGNOSIS — R52 Pain, unspecified: Secondary | ICD-10-CM | POA: Insufficient documentation

## 2015-07-30 LAB — COMPREHENSIVE METABOLIC PANEL
ALK PHOS: 67 U/L (ref 38–126)
ALT: 23 U/L (ref 14–54)
AST: 35 U/L (ref 15–41)
Albumin: 4.9 g/dL (ref 3.5–5.0)
Anion gap: 10 (ref 5–15)
BUN: 6 mg/dL (ref 6–20)
CALCIUM: 9.2 mg/dL (ref 8.9–10.3)
CO2: 25 mmol/L (ref 22–32)
CREATININE: 0.7 mg/dL (ref 0.44–1.00)
Chloride: 101 mmol/L (ref 101–111)
Glucose, Bld: 117 mg/dL — ABNORMAL HIGH (ref 65–99)
Potassium: 4 mmol/L (ref 3.5–5.1)
Sodium: 136 mmol/L (ref 135–145)
TOTAL PROTEIN: 7.7 g/dL (ref 6.5–8.1)
Total Bilirubin: 0.9 mg/dL (ref 0.3–1.2)

## 2015-07-30 LAB — CBC
HCT: 44.5 % (ref 36.0–46.0)
Hemoglobin: 15 g/dL (ref 12.0–15.0)
MCH: 33.3 pg (ref 26.0–34.0)
MCHC: 33.7 g/dL (ref 30.0–36.0)
MCV: 98.7 fL (ref 78.0–100.0)
PLATELETS: 302 10*3/uL (ref 150–400)
RBC: 4.51 MIL/uL (ref 3.87–5.11)
RDW: 12.9 % (ref 11.5–15.5)
WBC: 16.3 10*3/uL — AB (ref 4.0–10.5)

## 2015-07-30 LAB — I-STAT BETA HCG BLOOD, ED (MC, WL, AP ONLY)

## 2015-07-30 LAB — LIPASE, BLOOD: LIPASE: 23 U/L (ref 11–51)

## 2015-07-30 MED ORDER — SODIUM CHLORIDE 0.9 % IV BOLUS (SEPSIS)
2000.0000 mL | Freq: Once | INTRAVENOUS | Status: AC
  Administered 2015-07-30: 2000 mL via INTRAVENOUS

## 2015-07-30 MED ORDER — ONDANSETRON HCL 8 MG PO TABS: 8.0000 mg | ORAL_TABLET | Freq: Three times a day (TID) | ORAL | Status: DC | PRN

## 2015-07-30 MED ORDER — ONDANSETRON 4 MG PO TBDP
4.0000 mg | ORAL_TABLET | Freq: Once | ORAL | Status: AC
  Administered 2015-07-30: 4 mg via ORAL
  Filled 2015-07-30: qty 1

## 2015-07-30 MED ORDER — METOCLOPRAMIDE HCL 5 MG/ML IJ SOLN
10.0000 mg | Freq: Once | INTRAMUSCULAR | Status: AC
  Administered 2015-07-30: 10 mg via INTRAVENOUS
  Filled 2015-07-30: qty 2

## 2015-07-30 NOTE — Discharge Instructions (Signed)
Alcohol Abuse and Nutrition Avoid alcohol. They show that you drink at least 6 eight ounce glasses of water each day. Take Tylenol as needed for aches.. Take the medicine prescribed as needed for nausea. If you have an alcohol problem, get help. Call any of the numbers listed on the resource guide to get counseling Alcohol abuse is any pattern of alcohol consumption that harms your health, relationships, or work. Alcohol abuse can affect how your body breaks down and absorbs nutrients from food by causing your liver to work abnormally. Additionally, many people who abuse alcohol do not eat enough carbohydrates, protein, fat, vitamins, and minerals. This can cause poor nutrition (malnutrition) and a lack of nutrients (nutrient deficiencies), which can lead to further complications. Nutrients that are commonly lacking (deficient) among people who abuse alcohol include:  Vitamins.  Vitamin A. This is stored in your liver. It is important for your vision, metabolism, and ability to fight off infections (immunity).  B vitamins. These include vitamins such as folate, thiamin, and niacin. These are important in new cell growth and maintenance.  Vitamin C. This plays an important role in iron absorption, wound healing, and immunity.  Vitamin D. This is produced by your liver, but you can also get vitamin D from food. Vitamin D is necessary for your body to absorb and use calcium.  Minerals.  Calcium. This is important for your bones and your heart and blood vessel (cardiovascular) function.  Iron. This is important for blood, muscle, and nervous system functioning.  Magnesium. This plays an important role in muscle and nerve function, and it helps to control blood sugar and blood pressure.  Zinc. This is important for the normal function of your nervous system and digestive system (gastrointestinal tract). Nutrition is an essential component of therapy for alcohol abuse. Your health care provider or  dietitian will work with you to design a plan that can help restore nutrients to your body and prevent potential complications. WHAT IS MY PLAN? Your dietitian may develop a specific diet plan that is based on your condition and any other complications you may have. A diet plan will commonly include:  A balanced diet.  Grains: 6-8 oz per day.  Vegetables: 2-3 cups per day.  Fruits: 1-2 cups per day.  Meat and other protein: 5-6 oz per day.  Dairy: 2-3 cups per day.  Vitamin and mineral supplements. WHAT DO I NEED TO KNOW ABOUT ALCOHOL AND NUTRITION?  Consume foods that are high in antioxidants, such as grapes, berries, nuts, green tea, and dark green and orange vegetables. This can help to counteract some of the stress that is placed on your liver by consuming alcohol.  Avoid food and drinks that are high in fat and sugar. Foods such as sugared soft drinks, salty snack foods, and candy contain empty calories. This means that they lack important nutrients such as protein, fiber, and vitamins.  Eat frequent meals and snacks. Try to eat 5-6 small meals each day.  Eat a variety of fresh fruits and vegetables each day. This will help you get plenty of water, fiber, and vitamins in your diet.  Drink plenty of water and other clear fluids. Try to drink at least 48-64 oz (1.5-2 L) of water per day.  If you are a vegetarian, eat a variety of protein-rich foods. Pair whole grains with plant-based proteins at meals and snacks to obtain the greatest nutrient benefit from your food. For example, eat rice with beans, put peanut butter on  whole-grain toast, or eat oatmeal with sunflower seeds.  Soak beans and whole grains overnight before cooking. This can help your body to absorb the nutrients more easily.  Include foods fortified with vitamins and minerals in your diet. Commonly fortified foods include milk, orange juice, cereal, and bread.  If you are malnourished, your dietitian may recommend  a high-protein, high-calorie diet. This may include:  2,000-3,000 calories (kilocalories) per day.  70-100 grams of protein per day.  Your health care provider may recommend a complete nutritional supplement beverage. This can help to restore calories, protein, and vitamins to your body. Depending on your condition, you may be advised to consume this instead of or in addition to meals.  Limit your intake of caffeine. Replace drinks like coffee and black tea with decaffeinated coffee and herbal tea.  Eat a variety of foods that are high in omega fatty acids. These include fish, nuts and seeds, and soybeans. These foods may help your liver to recover and may also stabilize your mood.  Certain medicines may cause changes in your appetite, taste, and weight. Work with your health care provider and dietitian to make any adjustments to your medicines and diet plan.  Include other healthy lifestyle choices in your daily routine.  Be physically active.  Get enough sleep.  Spend time doing activities that you enjoy.  If you are unable to take in enough food and calories by mouth, your health care provider may recommend a feeding tube. This is a tube that passes through your nose and throat, directly into your stomach. Nutritional supplement beverages can be given to you through the feeding tube to help you get the nutrients you need.  Take vitamin or mineral supplements as recommended by your health care provider. WHAT FOODS CAN I EAT? Grains Enriched pasta. Enriched rice. Fortified whole-grain bread. Fortified whole-grain cereal. Barley. Brown rice. Quinoa. Millet. Vegetables All fresh, frozen, and canned vegetables. Spinach. Kale. Artichoke. Carrots. Winter squash and pumpkin. Sweet potatoes. Broccoli. Cabbage. Cucumbers. Tomatoes. Sweet peppers. Green beans. Peas. Corn. Fruits All fresh and frozen fruits. Berries. Grapes. Mango. Papaya. Guava. Cherries. Apples. Bananas. Peaches. Plums.  Pineapple. Watermelon. Cantaloupe. Oranges. Avocado. Meats and Other Protein Sources Beef liver. Lean beef. Pork. Fresh and canned chicken. Fresh fish. Oysters. Sardines. Canned tuna. Shrimp. Eggs with yolks. Nuts and seeds. Peanut butter. Beans and lentils. Soybeans. Tofu. Dairy Whole, low-fat, and nonfat milk. Whole, low-fat, and nonfat yogurt. Cottage cheese. Sour cream. Hard and soft cheeses. Beverages Water. Herbal tea. Decaffeinated coffee. Decaffeinated green tea. 100% fruit juice. 100% vegetable juice. Instant breakfast shakes. Condiments Ketchup. Mayonnaise. Mustard. Salad dressing. Barbecue sauce. Sweets and Desserts Sugar-free ice cream. Sugar-free pudding. Sugar-free gelatin. Fats and Oils Butter. Vegetable oil, flaxseed oil, olive oil, and walnut oil. Other Complete nutrition shakes. Protein bars. Sugar-free gum. The items listed above may not be a complete list of recommended foods or beverages. Contact your dietitian for more options. WHAT FOODS ARE NOT RECOMMENDED? Grains Sugar-sweetened breakfast cereals. Flavored instant oatmeal. Fried breads. Vegetables Breaded or deep-fried vegetables. Fruits Dried fruit with added sugar. Candied fruit. Canned fruit in syrup. Meats and Other Protein Sources Breaded or deep-fried meats. Dairy Flavored milks. Fried cheese curds or fried cheese sticks. Beverages Alcohol. Sugar-sweetened soft drinks. Sugar-sweetened tea. Caffeinated coffee and tea. Condiments Sugar. Honey. Agave nectar. Molasses. Sweets and Desserts Chocolate. Cake. Cookies. Candy. Other Potato chips. Pretzels. Salted nuts. Candied nuts. The items listed above may not be a complete list of foods and beverages to  avoid. Contact your dietitian for more information.   This information is not intended to replace advice given to you by your health care provider. Make sure you discuss any questions you have with your health care provider.   Document Released:  06/19/2005 Document Revised: 09/15/2014 Document Reviewed: 03/28/2014 Elsevier Interactive Patient Education 2016 [employer].  Emergency Department Resource Guide 1) Find a Doctor and Pay Out of Pocket Although you won't have to find out who is covered by your insurance plan, it is a good idea to ask around and get recommendations. You will then need to call the office and see if the doctor you have chosen will accept you as a new patient and what types of options they offer for patients who are self-pay. Some doctors offer discounts or will set up payment plans for their patients who do not have insurance, but you will need to ask so you aren't surprised when you get to your appointment.  2) Contact Your Local Health Department Not all health departments have doctors that can see patients for sick visits, but many do, so it is worth a call to see if yours does. If you don't know where your local health department is, you can check in your phone book. The CDC also has a tool to help you locate your state's health department, and many state websites also have listings of all of their local health departments.  3) Find a Walk-in Clinic If your illness is not likely to be very severe or complicated, you may want to try a walk in clinic. These are popping up all over the country in pharmacies, drugstores, and shopping centers. They're usually staffed by nurse practitioners or physician assistants that have been trained to treat common illnesses and complaints. They're usually fairly quick and inexpensive. However, if you have serious medical issues or chronic medical problems, these are probably not your best option.  No Primary Care Doctor: - Call Health Connect at  [phone] - they can help you locate a primary care doctor that  accepts your insurance, provides certain services, etc. - Physician Referral Service- [phone]  Chronic Pain Problems: Organization         Address  Phone    Notes  [facility]  [phone] Patients need to be referred by their primary care doctor.   Medication Assistance: Organization         Address  Phone   Notes  [facility] Medication [facility] [address] [place] [place], [place] [address] [phone] --Must be a resident of [facility] -- Must have NO insurance coverage whatsoever (no Medicaid/ Medicare, etc.) -- The pt. MUST have a primary care doctor that directs their care regularly and follows them in the community   [facility]ist  [phone]   [employer]  [phone]    Agencies that provide inexpensive medical care: Organization         Address  Phone   Notes  [facility]  [phone]   [facility]    [phone]   [facility] [address] [place], [place] [address] [phone]   [facility] [address] [place] [place] [phone]   Planned Parenthood    [phone]   [facility]    [phone]   [facility]  [address], [place] Phone:  [phone], Fax:  [phone])  [phone] Hours of Operation:  9 am - 6 pm, M-F.  Also accepts Medicaid/Medicare and self-pay.  [facility]  [address], [place] Phone: [phone], Fax: [phone]. Hours of Operation:  8:30 am - 5:30 pm, M-F.  Also accepts Medicaid and self-pay.  [facility] [place] [address] [place] Phone: [phone]   [facility] [address] Natasha Bence [place], [place] [phone], Ext. [phone] Mondays & Thursdays: 7-9 AM.  First 15 patients are seen on a first come, first serve basis.    Medicaid-accepting [facility] Providers:  Organization         Address  Phone   Notes  [facility] [address], [place] [phone] Also accepts self-pay patients.  [facility]  [address] Laurell Josephs [place], [place]  [phone]   [facility] [address], [place] [phone]   [facility] [address], [place] [phone]   Renaye Rakers [address], [place]   [phone] Only accepts [place] Access [place] patients after they have their name applied to their card.   Self-Pay (no insurance) in [facility]:  Organization         Address  Phone   Notes  Sickle Cell Patients, [facility] [address] [place], [place] [phone]   [facility] [address] [place], [place] [phone]   [facility] [place]  [address], [place] [phone]   [facility]/Dr. Osei-Bonsu  [address], [place] or [address], [place] [phone] Phone number for both [place] and [place] locations is the same.  [facility] [address] [place] [phone]   [facility] [address], [place] or [address] [phone]   [facility] [name], [place] [phone], phone; [phone], fax Sees patients 1st and 3rd Saturday of every month.  Must not qualify for public or private insurance (i.e. Medicaid, Medicare, [place] Health Choice, Veterans' Benefits)  Household income should be no more than 200% of the poverty level The clinic cannot treat you if you are pregnant or think you are pregnant  Sexually transmitted diseases are not treated at the clinic.    Dental Care: Organization         Address  Phone  Notes  [facility] Department of [facility] [address] [place], [place] [phone] Accepts children up to age 14 who are enrolled in [place] or [place] Health Choice; pregnant women with a Medicaid card; and children who have  applied for Medicaid or [place] Health Choice, but were declined, whose parents can pay a reduced fee at time of service.  [facility] Department of [facility]  [address], [place] [phone] Accepts children up to age 34 who are enrolled in [place] or [place] Health Choice; pregnant women with a Medicaid card; and children who have applied for Medicaid or [place] Health Choice, but were declined, whose parents can pay a reduced fee at time of service.  [facility]  [address] [place], [place] [phone] Patients are seen by appointment only. Walk-ins are not accepted. [facility]  will see patients 85 years of age and older. Monday - Tuesday (8am-5pm) Most Wednesdays (8:30-5pm) $30 per visit, cash only  [facility]  [address], [facility] [phone] Patients are seen by appointment only. Walk-ins are not accepted. [facility] will see patients 43 years of age and older. One Wednesday Evening (Monthly: Volunteer Based).  $30 per visit, cash only  [employer]  [phone] for adults; Children under age 21, call Graduate Pediatric Dentistry at [phone]. Children aged 23-14, please call [phone] to request a pediatric application.  Dental services are provided in all areas of dental care including fillings, crowns and bridges, complete and partial dentures, implants, gum treatment, root canals, and extractions. Preventive care is also provided. Treatment is provided to both adults and children. Patients are selected via a lottery and there is often a waiting list.   [facility] [address] [place]  [phone] [email or web address]   [facility] [address] [place], [place] [phone], Ext. [phone] Second and Fourth Thursday of each month, opens at 6:30 AM; Clinic ends at 9 AM.  Patients are seen on a first-come first-served basis, and a  limited number are seen during each clinic.   [facility]  [address] Ether Griffins [place], [place] [phone]   Eligibility Requirements You must have lived in [place], [place], or [place] counties for at least the last three months.   You cannot be eligible for state or federal sponsored [place], including [employer], [place], or [employer].   You generally cannot be eligible for healthcare insurance through your employer.    How to apply: Eligibility screenings are held every Tuesday and Wednesday afternoon from 1:00 pm until 4:00 pm. You do not need an appointment for the interview!  [facility] [address], [place], [place] [phone]   [facility]  [phone]   [facility]  [phone]   [facility]  [phone]    Behavioral Health Resources in the Community: Intensive Outpatient Programs Organization         Address  Phone  Notes  [facility] [address], [place], [place] [phone]   [facility]patient [address], [place], [place] [phone]   [facility] [address], [place], [place]  [phone]   [facility] [address]  [place], [place] [phone] or [phone]   Substance Abuse Resources Organization         Address  Phone  Notes  [facility]  [phone]   [facility]  [phone]   [facility]  [phone]   Floydene Flock  [phone]   Residential & Outpatient Substance Abuse Program  [phone]   Psychological Services Organization         Address  Phone  Notes  [facility] Health  [phone]   [facility]  [phone]   [facility] Mental Health [address], [place] [phone] or [phone]    Mobile Crisis Teams Organization          Address  Phone  Notes  [facility], Mobile Crisis Care Unit  [phone]   [facility]  [address] [place], [place] [phone]   [facility] [address] Liborio Negrin [place] [phone]    Self-Help/Support Groups Organization  Address  Phone             Notes  [facility] [place] - variety of support groups  336- I7437963 Call for more information  Narcotics Anonymous (NA), [facility] [address], [employer] [place]  2 meetings at this location   Statistician         Address  Phone  Notes  [facility] [address],    [place]  [phone]   [facility]  [address], [place] [address], [place], [place] [phone]   [facility] [address] [place], [place] [phone] Admissions: 8am-3pm M-F  [facility] [address]    [place], [place] [phone]   [facility] [address] [place], [place], [place] [phone]   [facility] [address]  [place], [place] [phone]   [facility] - Intensive Outpatient [address] Laurell Josephs [address], [place], [place] [phone]   [facility] ([facility] [address]  [place], [place] [phone] or [phone]   [facility] ([facility]) [address] [place], [place] [phone] Accepts Medicaid  [facility] [address],  [place] [phone] Substance Abuse/Addiction Treatment   [facility] Organization         Address  Phone  Notes  [facility]  [phone]   Angie Fava, PhD [address] Ervin Knack [place], [place]   [phone] or [phone]   [facility] Behavioral   [address] [place], [place] [phone]   [facility] [address] [place], [place] [phone] Insurance/Medicaid/sponsorship  through [facility] and Families [address]                                    [place], [place] [phone] Therapy/tele-psych/case  [facility] [address] [place] [address], [place] [phone]    Dr. Lolly Mustache  [phone]   [facility]  [employer] [facility] Dept. 1) [address], [place] 2) [address], [place] 3)  [address], [place] [phone]  [phone]   [facility] Child Abuse Hotline [phone] or [phone] (After Hours)

## 2015-07-30 NOTE — ED Notes (Signed)
Pt stable, ambulatory, states understanding of discharge instructions 

## 2015-07-30 NOTE — ED Provider Notes (Signed)
CSN: [id number]     Arrival date & time 07/30/15  1409 History   First MD Initiated Contact with Patient 07/30/15 2001     Chief Complaint  Patient presents with  . Generalized Body Aches  . Diarrhea     (Consider location/radiation/quality/duration/timing/severity/associated sxs/prior Treatment) HPI Patient went on a drinking binge this past weekend, ending yesterday. Today she complains of generalized body aches, generalized weakness nausea and has had 2 episodes of diarrhea, nonbilious dome body. She denies vomiting denies fever. No other associated symptoms. No treatment prior to coming here History reviewed. No pertinent past medical history. History reviewed. No pertinent past surgical history. past medical history alcohol abuse History reviewed. No pertinent family history. Social History  Substance Use Topics  . Smoking status: Current Every Day Smoker    Types: Cigarettes  . Smokeless tobacco: None  . Alcohol Use: Yes   no illicit drug use  OB History    No data available     Review of Systems  HENT: Negative.   Respiratory: Negative.   Cardiovascular: Negative.   Gastrointestinal: Positive for nausea.  Musculoskeletal: Positive for myalgias.  Skin: Negative.   Neurological: Positive for weakness.       Generalized weakness  Psychiatric/Behavioral: Negative.   All other systems reviewed and are negative.     Allergies  Review of patient's allergies indicates no known allergies.  Home Medications   Prior to Admission medications   Medication Sig Start Date End Date Taking? Authorizing Provider  azithromycin (ZITHROMAX) 250 MG tablet Take 1 tablet (250 mg total) by mouth daily. Take first 2 tablets together, then 1 every day until finished. Patient not taking: Reported on 07/30/2015 05/16/15   Danelle BerryLeisa Tapia, PA-C  guaiFENesin-dextromethorphan (ROBITUSSIN DM) 100-10 MG/5ML syrup Take 5 mLs by mouth every 4 (four) hours as needed for cough. Patient not taking:  Reported on 07/30/2015 05/16/15   Danelle BerryLeisa Tapia, PA-C  predniSONE (DELTASONE) 20 MG tablet Take 2 tablets (40 mg total) by mouth daily. Patient not taking: Reported on 07/30/2015 05/16/15   Danelle BerryLeisa Tapia, PA-C   BP 114/96 mmHg  Pulse 75  Temp(Src) 98 F (36.7 C) (Oral)  Resp 18  SpO2 99%  LMP 07/27/2015 Physical Exam  Constitutional: She is oriented to person, place, and time. She appears well-developed and well-nourished. No distress.  HENT:  Head: Normocephalic and atraumatic.  mucus membranes dry  Eyes: Conjunctivae are normal. Pupils are equal, round, and reactive to light.  Neck: Neck supple. No tracheal deviation present. No thyromegaly present.  Cardiovascular: Normal rate and regular rhythm.   No murmur heard. Pulmonary/Chest: Effort normal and breath sounds normal.  Abdominal: Soft. Bowel sounds are normal. She exhibits no distension. There is no tenderness.  Musculoskeletal: Normal range of motion. She exhibits no edema or tenderness.  Neurological: She is alert and oriented to person, place, and time. No cranial nerve deficit. Coordination normal.  Skin: Skin is warm and dry. No rash noted.  Psychiatric: She has a normal mood and affect.  Nursing note and vitals reviewed.   ED Course  Procedures (including critical care time) Labs Review Labs Reviewed  COMPREHENSIVE METABOLIC PANEL - Abnormal; Notable for the following:    Glucose, Bld 117 (*)    All other components within normal limits  CBC - Abnormal; Notable for the following:    WBC 16.3 (*)    All other components within normal limits  LIPASE, BLOOD  URINALYSIS, ROUTINE W REFLEX MICROSCOPIC (NOT AT [facility])  I-STAT  BETA HCG BLOOD, ED ([facility], [facility], [facility] ONLY)    Imaging Review No results found. I have personally reviewed and evaluated these images and lab results as part of my medical decision-making.   EKG Interpretation None     10:05 PM feels improved after treatment with intravenous fluids and intravenous Reglan.  Nausea is improved.  10:20 PM patient became nauseated after drinking water. She did not vomit. Zofran ordered. 10:50 PM she feels ready to home. Results for orders placed or performed during the hospital encounter of 07/30/15  Lipase, blood  Result Value Ref Range   Lipase 23 11 - 51 U/L  Comprehensive metabolic panel  Result Value Ref Range   Sodium 136 135 - 145 mmol/L   Potassium 4.0 3.5 - 5.1 mmol/L   Chloride 101 101 - 111 mmol/L   CO2 25 22 - 32 mmol/L   Glucose, Bld 117 (H) 65 - 99 mg/dL   BUN 6 6 - 20 mg/dL   Creatinine, Ser 4.09 0.44 - 1.00 mg/dL   Calcium 9.2 8.9 - 81.1 mg/dL   Total Protein 7.7 6.5 - 8.1 g/dL   Albumin 4.9 3.5 - 5.0 g/dL   AST 35 15 - 41 U/L   ALT 23 14 - 54 U/L   Alkaline Phosphatase 67 38 - 126 U/L   Total Bilirubin 0.9 0.3 - 1.2 mg/dL   GFR calc non Af Amer >60 >60 mL/min   GFR calc Af Amer >60 >60 mL/min   Anion gap 10 5 - 15  CBC  Result Value Ref Range   WBC 16.3 (H) 4.0 - 10.5 K/uL   RBC 4.51 3.87 - 5.11 MIL/uL   Hemoglobin 15.0 12.0 - 15.0 g/dL   HCT 91.4 78.2 - 95.6 %   MCV 98.7 78.0 - 100.0 fL   MCH 33.3 26.0 - 34.0 pg   MCHC 33.7 30.0 - 36.0 g/dL   RDW 21.3 08.6 - 57.8 %   Platelets 302 150 - 400 K/uL  I-Stat beta hCG blood, ED ([facility], [facility], [facility] only)  Result Value Ref Range   I-stat hCG, quantitative <5.0 <5 mIU/mL   Comment 3           No results found.  MDM  planAn prescription Zofran. Encourage oral hydration. I encouraged her to stay away from alcohol. Symptoms likely secondary to alcohol abuse and dehydration Final diagnoses:  None   Pt given resource guide Dx Alcohol abuse #2 mild dehydration     Doug Sou, MD 07/30/15 2309

## 2015-07-30 NOTE — ED Notes (Signed)
Verbal order for 1 zofran tablet from dr. Dione BoozeJacobawitz.

## 2015-07-30 NOTE — ED Notes (Signed)
Pt reports not feeling well x 3 days. Having bodyaches, diarrhea and feeling very anxious. No acute distress noted at triage.

## 2015-12-11 ENCOUNTER — Emergency Department (HOSPITAL_COMMUNITY): Payer: BLUE CROSS/BLUE SHIELD

## 2015-12-11 ENCOUNTER — Encounter (HOSPITAL_COMMUNITY): Payer: Self-pay

## 2015-12-11 ENCOUNTER — Emergency Department (HOSPITAL_COMMUNITY)
Admission: EM | Admit: 2015-12-11 | Discharge: 2015-12-11 | Disposition: A | Discharge status: Home / Self Care | Payer: BLUE CROSS/BLUE SHIELD | Attending: Emergency Medicine | Admitting: Emergency Medicine

## 2015-12-11 DIAGNOSIS — R05 Cough: Secondary | ICD-10-CM | POA: Diagnosis present

## 2015-12-11 DIAGNOSIS — J209 Acute bronchitis, unspecified: Secondary | ICD-10-CM | POA: Insufficient documentation

## 2015-12-11 DIAGNOSIS — F17210 Nicotine dependence, cigarettes, uncomplicated: Secondary | ICD-10-CM | POA: Insufficient documentation

## 2015-12-11 DIAGNOSIS — R059 Cough, unspecified: Secondary | ICD-10-CM

## 2015-12-11 DIAGNOSIS — J40 Bronchitis, not specified as acute or chronic: Secondary | ICD-10-CM

## 2015-12-11 DIAGNOSIS — R000 Tachycardia, unspecified: Secondary | ICD-10-CM | POA: Insufficient documentation

## 2015-12-11 MED ORDER — AZITHROMYCIN 250 MG PO TABS: ORAL_TABLET | ORAL | Status: DC

## 2015-12-11 MED ORDER — ALBUTEROL SULFATE HFA 108 (90 BASE) MCG/ACT IN AERS
2.0000 | INHALATION_SPRAY | RESPIRATORY_TRACT | Status: DC
  Administered 2015-12-11: 2 via RESPIRATORY_TRACT
  Filled 2015-12-11: qty 6.7

## 2015-12-11 NOTE — ED Notes (Signed)
Pt transported to Xray. 

## 2015-12-11 NOTE — Discharge Instructions (Signed)

## 2015-12-11 NOTE — ED Provider Notes (Signed)
CSN: [id number]     Arrival date & time 12/11/15  0015 History   First MD Initiated Contact with Patient 12/11/15 0110     Chief Complaint  Patient presents with  . Generalized Body Aches     (Consider location/radiation/quality/duration/timing/severity/associated sxs/prior Treatment) HPI Comments: Patient here complaining of having cough and congestion for 2 weeks. Cough is been nonproductive. She does have a history of tobacco use. No fever or chills. No current emesis at this time but has had some watery diarrhea. Using over-the-counter medication without relief. Has had some depression due to being frustrated about her illness but denies any active suicidal ideations with a plan.  The history is provided by the patient.    History reviewed. No pertinent past medical history. History reviewed. No pertinent past surgical history. History reviewed. No pertinent family history. Social History  Substance Use Topics  . Smoking status: Current Every Day Smoker    Types: Cigarettes  . Smokeless tobacco: None  . Alcohol Use: Yes   OB History    No data available     Review of Systems  All other systems reviewed and are negative.     Allergies  Review of patient's allergies indicates no known allergies.  Home Medications   Prior to Admission medications   Medication Sig Start Date End Date Taking? Authorizing Provider  azithromycin (ZITHROMAX) 250 MG tablet Take 1 tablet (250 mg total) by mouth daily. Take first 2 tablets together, then 1 every day until finished. Patient not taking: Reported on 07/30/2015 05/16/15   Danelle Berry, PA-C  guaiFENesin-dextromethorphan (ROBITUSSIN DM) 100-10 MG/5ML syrup Take 5 mLs by mouth every 4 (four) hours as needed for cough. Patient not taking: Reported on 07/30/2015 05/16/15   Danelle Berry, PA-C  ondansetron (ZOFRAN) 8 MG tablet Take 1 tablet (8 mg total) by mouth every 8 (eight) hours as needed for nausea. Patient not taking: Reported on  12/11/2015 07/30/15   Doug Sou, MD  predniSONE (DELTASONE) 20 MG tablet Take 2 tablets (40 mg total) by mouth daily. Patient not taking: Reported on 07/30/2015 05/16/15   Danelle Berry, PA-C   BP 133/95 mmHg  Pulse 108  Temp(Src) 97.8 F (36.6 C) (Oral)  Resp 26  SpO2 99%  LMP 12/10/2015 Physical Exam  Constitutional: She is oriented to person, place, and time. She appears well-developed and well-nourished.  Non-toxic appearance. No distress.  HENT:  Head: Normocephalic and atraumatic.  Eyes: Conjunctivae, EOM and lids are normal. Pupils are equal, round, and reactive to light.  Neck: Normal range of motion. Neck supple. No tracheal deviation present. No thyroid mass present.  Cardiovascular: Regular rhythm and normal heart sounds.  Tachycardia present.  Exam reveals no gallop.   No murmur heard. Pulmonary/Chest: Effort normal and breath sounds normal. No stridor. No respiratory distress. She has no decreased breath sounds. She has no wheezes. She has no rhonchi. She has no rales.  Abdominal: Soft. Normal appearance and bowel sounds are normal. She exhibits no distension. There is no tenderness. There is no rebound and no CVA tenderness.  Musculoskeletal: Normal range of motion. She exhibits no edema or tenderness.  Neurological: She is alert and oriented to person, place, and time. She has normal strength. No cranial nerve deficit or sensory deficit. GCS eye subscore is 4. GCS verbal subscore is 5. GCS motor subscore is 6.  Skin: Skin is warm and dry. No abrasion and no rash noted.  Psychiatric: She has a normal mood and affect. Her speech  is normal and behavior is normal.  Nursing note and vitals reviewed.   ED Course  Procedures (including critical care time) Labs Review Labs Reviewed - No data to display  Imaging Review No results found. I have personally reviewed and evaluated these images and lab results as part of my medical decision-making.   EKG  Interpretation   Date/Time:  Tuesday December 11 2015 00:23:54 EDT Ventricular Rate:  106 PR Interval:  141 QRS Duration: 67 QT Interval:  337 QTC Calculation: 447 R Axis:   71 Text Interpretation:  Sinus tachycardia Anteroseptal infarct, old  Confirmed by Yuan Gann  MD, Mccall Will ([id number]) on 12/11/2015 1:10:49 AM      MDM   Final diagnoses:  Cough    Patient to be treated for bronchitis. She endorses some depression but denies any active suicidal or homicidal ideations. Does not want to see psychiatry at this time.    Lorre NickAnthony Michaell Grider, MD 12/11/15 [phone]

## 2015-12-11 NOTE — ED Notes (Signed)
Pt complains of being sick for two weeks, she states that she's had a cough, chest wall pain, no appetitie and has lost 10 pounds in a week

## 2015-12-17 ENCOUNTER — Emergency Department (HOSPITAL_COMMUNITY)
Admission: EM | Admit: 2015-12-17 | Discharge: 2015-12-17 | Disposition: A | Discharge status: Home / Self Care | Payer: BLUE CROSS/BLUE SHIELD | Attending: Emergency Medicine | Admitting: Emergency Medicine

## 2015-12-17 ENCOUNTER — Encounter (HOSPITAL_COMMUNITY): Payer: Self-pay | Admitting: Emergency Medicine

## 2015-12-17 DIAGNOSIS — R45851 Suicidal ideations: Secondary | ICD-10-CM | POA: Diagnosis present

## 2015-12-17 DIAGNOSIS — F17210 Nicotine dependence, cigarettes, uncomplicated: Secondary | ICD-10-CM | POA: Insufficient documentation

## 2015-12-17 LAB — CBC
HCT: 45 % (ref 36.0–46.0)
Hemoglobin: 15.3 g/dL — ABNORMAL HIGH (ref 12.0–15.0)
MCH: 33.3 pg (ref 26.0–34.0)
MCHC: 34 g/dL (ref 30.0–36.0)
MCV: 97.8 fL (ref 78.0–100.0)
Platelets: 273 10*3/uL (ref 150–400)
RBC: 4.6 MIL/uL (ref 3.87–5.11)
RDW: 13.2 % (ref 11.5–15.5)
WBC: 5.7 10*3/uL (ref 4.0–10.5)

## 2015-12-17 LAB — COMPREHENSIVE METABOLIC PANEL
ALBUMIN: 4.5 g/dL (ref 3.5–5.0)
ALK PHOS: 80 U/L (ref 38–126)
ALT: 26 U/L (ref 14–54)
AST: 38 U/L (ref 15–41)
Anion gap: 13 (ref 5–15)
BILIRUBIN TOTAL: 0.5 mg/dL (ref 0.3–1.2)
CO2: 25 mmol/L (ref 22–32)
CREATININE: 0.68 mg/dL (ref 0.44–1.00)
Calcium: 9.2 mg/dL (ref 8.9–10.3)
Chloride: 104 mmol/L (ref 101–111)
GFR calc Af Amer: >60 mL/min (ref >60)
GLUCOSE: 133 mg/dL — AB (ref 65–99)
Potassium: 3.4 mmol/L — ABNORMAL LOW (ref 3.5–5.1)
Sodium: 142 mmol/L (ref 135–145)
TOTAL PROTEIN: 7.5 g/dL (ref 6.5–8.1)

## 2015-12-17 LAB — ETHANOL: ALCOHOL ETHYL (B): 202 mg/dL — AB (ref <5)

## 2015-12-17 NOTE — ED Notes (Signed)
Security called to wand pt  

## 2015-12-17 NOTE — ED Notes (Signed)
Pt was in triage in purple scrubs waiting to go back to a room to have a medical screening when patient states she isn't a danger to herself and wishes to go home and follow up with someone tomorrow as a outpatient. This rn informed pt it was best to stay and receive the help she needed. Pt refuses to stay and demands her clothes and belonging be given back. pts belongings ,cell phone, wallet and clothes given back to her and pt left before being seen by a MD.

## 2015-12-17 NOTE — ED Notes (Signed)
Staffing called for sitter.   
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
## 2015-12-20 ENCOUNTER — Inpatient Hospital Stay (HOSPITAL_COMMUNITY)
Admission: AD | Admit: 2015-12-20 | Discharge: 2015-12-26 | DRG: 885 | Disposition: A | Discharge status: Home / Self Care | Payer: BLUE CROSS/BLUE SHIELD | Source: Intra-hospital | Attending: Psychiatry | Admitting: Psychiatry

## 2015-12-20 ENCOUNTER — Emergency Department (HOSPITAL_BASED_OUTPATIENT_CLINIC_OR_DEPARTMENT_OTHER)
Admission: EM | Admit: 2015-12-20 | Discharge: 2015-12-20 | Disposition: A | Discharge status: Other Acute Inpatient Hospital | Payer: BLUE CROSS/BLUE SHIELD | Attending: Emergency Medicine | Admitting: Emergency Medicine

## 2015-12-20 ENCOUNTER — Encounter (HOSPITAL_BASED_OUTPATIENT_CLINIC_OR_DEPARTMENT_OTHER): Payer: Self-pay

## 2015-12-20 ENCOUNTER — Encounter (HOSPITAL_COMMUNITY): Payer: Self-pay | Admitting: *Deleted

## 2015-12-20 DIAGNOSIS — R45851 Suicidal ideations: Secondary | ICD-10-CM | POA: Diagnosis present

## 2015-12-20 DIAGNOSIS — F329 Major depressive disorder, single episode, unspecified: Secondary | ICD-10-CM | POA: Insufficient documentation

## 2015-12-20 DIAGNOSIS — F17210 Nicotine dependence, cigarettes, uncomplicated: Secondary | ICD-10-CM | POA: Diagnosis present

## 2015-12-20 DIAGNOSIS — Z825 Family history of asthma and other chronic lower respiratory diseases: Secondary | ICD-10-CM

## 2015-12-20 DIAGNOSIS — Z818 Family history of other mental and behavioral disorders: Secondary | ICD-10-CM

## 2015-12-20 DIAGNOSIS — F4310 Post-traumatic stress disorder, unspecified: Secondary | ICD-10-CM | POA: Diagnosis present

## 2015-12-20 DIAGNOSIS — Z79899 Other long term (current) drug therapy: Secondary | ICD-10-CM | POA: Diagnosis not present

## 2015-12-20 DIAGNOSIS — G4700 Insomnia, unspecified: Secondary | ICD-10-CM | POA: Diagnosis present

## 2015-12-20 DIAGNOSIS — F332 Major depressive disorder, recurrent severe without psychotic features: Secondary | ICD-10-CM | POA: Diagnosis present

## 2015-12-20 DIAGNOSIS — F39 Unspecified mood [affective] disorder: Secondary | ICD-10-CM

## 2015-12-20 HISTORY — DX: Depression, unspecified: F32.A

## 2015-12-20 HISTORY — DX: Major depressive disorder, single episode, unspecified: F32.9

## 2015-12-20 LAB — RAPID URINE DRUG SCREEN, HOSP PERFORMED
Amphetamines: NOT DETECTED
BARBITURATES: NOT DETECTED
Benzodiazepines: NOT DETECTED
COCAINE: POSITIVE — AB
Opiates: NOT DETECTED
Tetrahydrocannabinol: POSITIVE — AB

## 2015-12-20 LAB — CBC WITH DIFFERENTIAL/PLATELET
Basophils Absolute: 0 10*3/uL (ref 0.0–0.1)
Basophils Relative: 0 %
EOS PCT: 1 %
Eosinophils Absolute: 0.1 10*3/uL (ref 0.0–0.7)
HCT: 37.5 % (ref 36.0–46.0)
Hemoglobin: 13 g/dL (ref 12.0–15.0)
LYMPHS ABS: 2.2 10*3/uL (ref 0.7–4.0)
LYMPHS PCT: 33 %
MCH: 33.7 pg (ref 26.0–34.0)
MCHC: 34.7 g/dL (ref 30.0–36.0)
MCV: 97.2 fL (ref 78.0–100.0)
MONO ABS: 0.6 10*3/uL (ref 0.1–1.0)
Monocytes Relative: 9 %
Neutro Abs: 3.7 10*3/uL (ref 1.7–7.7)
Neutrophils Relative %: 57 %
PLATELETS: 189 10*3/uL (ref 150–400)
RBC: 3.86 MIL/uL — ABNORMAL LOW (ref 3.87–5.11)
RDW: 12.1 % (ref 11.5–15.5)
WBC: 6.6 10*3/uL (ref 4.0–10.5)

## 2015-12-20 LAB — COMPREHENSIVE METABOLIC PANEL
ALBUMIN: 4.2 g/dL (ref 3.5–5.0)
ALT: 16 U/L (ref 14–54)
AST: 24 U/L (ref 15–41)
Alkaline Phosphatase: 76 U/L (ref 38–126)
Anion gap: 5 (ref 5–15)
BILIRUBIN TOTAL: 2.5 mg/dL — AB (ref 0.3–1.2)
BUN: 9 mg/dL (ref 6–20)
CHLORIDE: 101 mmol/L (ref 101–111)
CO2: 29 mmol/L (ref 22–32)
Calcium: 8.8 mg/dL — ABNORMAL LOW (ref 8.9–10.3)
Creatinine, Ser: 0.52 mg/dL (ref 0.44–1.00)
GFR calc Af Amer: >60 mL/min (ref >60)
GFR calc non Af Amer: >60 mL/min (ref >60)
GLUCOSE: 117 mg/dL — AB (ref 65–99)
POTASSIUM: 3.4 mmol/L — AB (ref 3.5–5.1)
Sodium: 135 mmol/L (ref 135–145)
TOTAL PROTEIN: 6.5 g/dL (ref 6.5–8.1)

## 2015-12-20 LAB — PREGNANCY, URINE: PREG TEST UR: NEGATIVE

## 2015-12-20 LAB — ETHANOL: Alcohol, Ethyl (B): <5 mg/dL (ref <5)

## 2015-12-20 MED ORDER — ALUM & MAG HYDROXIDE-SIMETH 200-200-20 MG/5ML PO SUSP: 30.0000 mL | ORAL | Status: DC | PRN

## 2015-12-20 MED ORDER — TRAZODONE HCL 50 MG PO TABS
50.0000 mg | ORAL_TABLET | Freq: Every evening | ORAL | Status: DC | PRN
  Administered 2015-12-20 – 2015-12-22 (×3): 50 mg via ORAL
  Filled 2015-12-20 (×3): qty 1

## 2015-12-20 MED ORDER — ACETAMINOPHEN 325 MG PO TABS: 650.0000 mg | ORAL_TABLET | Freq: Four times a day (QID) | ORAL | Status: DC | PRN

## 2015-12-20 MED ORDER — HYDROXYZINE HCL 25 MG PO TABS
25.0000 mg | ORAL_TABLET | Freq: Four times a day (QID) | ORAL | Status: DC | PRN
  Administered 2015-12-23 – 2015-12-25 (×3): 25 mg via ORAL
  Filled 2015-12-20 (×3): qty 1

## 2015-12-20 MED ORDER — MAGNESIUM HYDROXIDE 400 MG/5ML PO SUSP: 30.0000 mL | Freq: Every day | ORAL | Status: DC | PRN

## 2015-12-20 NOTE — ED Notes (Signed)
Attempted to give report to RN, RN still in report and unable to take report from this RN, will attempt to call again in 15 min

## 2015-12-20 NOTE — ED Notes (Signed)
Report given to treka
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
## 2015-12-20 NOTE — ED Notes (Signed)
Kizzie FurnishAlaina, [facility] charge nurse calls this rn to report that they are unable to take pt at this time due to high census. Will notify me when they are able to take pt.

## 2015-12-20 NOTE — ED Notes (Signed)
Pt's  Clothing was removed and burgundy scrubs given to wear, pt was then wanded and reasons for activity explained

## 2015-12-20 NOTE — ED Notes (Signed)
Monitor placed at bedside for tts assessment.

## 2015-12-20 NOTE — Tx Team (Signed)
Initial Interdisciplinary Treatment Plan   PATIENT STRESSORS: Loss of father Traumatic event   PATIENT STRENGTHS: Ability for insight Capable of independent living Communication skills General fund of knowledge Motivation for treatment/growth Supportive family/friends   PROBLEM LIST: Problem List/Patient Goals Date to be addressed Date deferred Reason deferred Estimated date of resolution  At risk for suicide 12/20/2015  12/20/2015   D/C  Depression 12/20/2015  12/20/2015   D/C  Agression 12/20/2015  12/20/2015   D/C  "I'm depressed. Don't want to get up and do anything" 12/20/2015  12/20/2015   D/C  "Just be able to function" 12/20/2015  12/20/2015   D/C  "Managing my moods" 12/20/2015  12/20/2015   D/C                     DISCHARGE CRITERIA:  Improved stabilization in mood, thinking, and/or behavior Motivation to continue treatment in a less acute level of care Need for constant or close observation no longer present Reduction of life-threatening or endangering symptoms to within safe limits  PRELIMINARY DISCHARGE PLAN: Outpatient therapy Return to previous living arrangement Return to previous work or school arrangements  PATIENT/FAMIILY INVOLVEMENT: This treatment plan has been presented to and reviewed with the patient, Courtney Hansen, and/or family member.  The patient and family have been given the opportunity to ask questions and make suggestions.  Larry SierrasMiddleton, Daunte Oestreich P 12/20/2015, 9:41 PM

## 2015-12-20 NOTE — BH Assessment (Signed)
Per Renata Capriceonrad, NP - patient meets criteria for inpatient hospitalization.  [facility]  TTS will seek placement.

## 2015-12-20 NOTE — BH Assessment (Signed)
Writer informed the RN that the [facility]  Dr. Jama Flavorsobos is the accepting physician.  The number to call report is [phone].  The nurse will [facility] the [facility] [phone].  The nurse will arrange transportation through [facility].  [facility].  Writer faxed a blank consent form to [phone].

## 2015-12-20 NOTE — ED Notes (Signed)
MD at bedside. 

## 2015-12-20 NOTE — ED Notes (Signed)
[facility] notified of need for tranport

## 2015-12-20 NOTE — ED Notes (Signed)
Pt reports that [facility] meeting with her new pcp to get establishment of care, pt stated that her pcp sent her here to determine if she has depression or bipolar and to get medications started. Pt denies SI/HI, no thought of harming herself, no plan. Pt states that she is unable to get oob some days but not si. Educated patient on process for behavioral care and telepsych if needed
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
## 2015-12-20 NOTE — ED Notes (Addendum)
Pt states she was sent from PCP (new pt visit) for evaluation for bipolar-pt states she has hx of depression w/o compliance of meds-pt NAD-steady gait-denies SI/HI

## 2015-12-20 NOTE — ED Notes (Signed)
Clear stone gold colored ring, along with gold colored earring and nose ring placed in biohazard bag and sealed, then bag was placed in belongings bag and locked up for family to take home.

## 2015-12-20 NOTE — Progress Notes (Signed)
Patient ID: Courtney Hansen, female   DOB: 1976-04-17, 40 y.o.   MRN: [id number]  Received report from Linna Hoffa Treka, RN. D: Client in bed, but request sleep medications. A: Writer introduced self to client, reviewed, administered Trazodone  50 mg for sleep. Staff will monitor q4915min for safety. R: Client is safe on the unit.

## 2015-12-20 NOTE — ED Provider Notes (Signed)
CSN: [id number]     Arrival date & time 12/20/15  1247 History   First MD Initiated Contact with Patient 12/20/15 1312     Chief Complaint  Patient presents with  . Depression     Patient is a 40 y.o. female presenting with depression. The history is provided by the patient. No language interpreter was used.  Depression   Courtney Hansen is a 40 y.o. female who presents to the Emergency Department complaining of Psychiatric evaluation. She was seen by her primary care physician today for depressive type symptoms. Her PCP referred her to the emergency department for evaluation of possible bipolar disorder. She reports increased feelings of depression, worse over the last few weeks. She is able to function at work but when she is at home she is having decreased sleep and decreased energy, decreased appetite. She tries to self medicate with alcohol. It has been several days since she last drank. No history of alcohol withdrawal in the past. She doesn't have any suicidal plans but she does have occasional vague thoughts of not wanting to be here anymore. She presents to the emergency Department voluntarily for evaluation. She has no other additional complaints at this time. She was seen in the emergency department 3 days ago for similar symptoms but left without being evaluated by a provider. She had lab work performed at that time.  Past Medical History  Diagnosis Date  . Depression    History reviewed. No pertinent past surgical history. No family history on file. Social History  Substance Use Topics  . Smoking status: Current Every Day Smoker -- 0.50 packs/day    Types: Cigarettes  . Smokeless tobacco: None  . Alcohol Use: Yes     Comment: daily    OB History    No data available     Review of Systems  Psychiatric/Behavioral: Positive for depression.  All other systems reviewed and are negative.     Allergies  Review of patient's allergies indicates no known allergies.  Home  Medications   Prior to Admission medications   Not on File   BP 103/65 mmHg  Pulse 61  Temp(Src) 98.5 F (36.9 C) (Oral)  Resp 18  Ht 4\' 10"  (1.473 m)  Wt 97 lb 8 oz (44.226 kg)  BMI 20.38 kg/m2  SpO2 98%  LMP 12/06/2015 Physical Exam  Constitutional: She is oriented to person, place, and time. She appears well-developed and well-nourished.  HENT:  Head: Normocephalic and atraumatic.  Cardiovascular: Normal rate and regular rhythm.   Pulmonary/Chest: Effort normal. No respiratory distress.  Musculoskeletal: Normal range of motion.  Neurological: She is alert and oriented to person, place, and time.  Skin: Skin is warm.  Psychiatric:  Flat affect  Nursing note and vitals reviewed.   ED Course  Procedures (including critical care time) Labs Review Labs Reviewed  PREGNANCY, URINE  URINE RAPID DRUG SCREEN, HOSP PERFORMED  ETHANOL  COMPREHENSIVE METABOLIC PANEL  CBC WITH DIFFERENTIAL/PLATELET    Imaging Review No results found. I have personally reviewed and evaluated these images and lab results as part of my medical decision-making.   EKG Interpretation None      MDM   Final diagnoses:  None    Patient presents for evaluation of depressive symptoms.  She is not suicidal in the emergency department and comes in seeking treatment following referral by her PCP.  TTS evaluation pending.    Tilden FossaElizabeth Aaryana Betke, MD 12/20/15 [phone]

## 2015-12-21 ENCOUNTER — Encounter (HOSPITAL_COMMUNITY): Payer: Self-pay | Admitting: Psychiatry

## 2015-12-21 DIAGNOSIS — F332 Major depressive disorder, recurrent severe without psychotic features: Principal | ICD-10-CM

## 2015-12-21 MED ORDER — ENSURE ENLIVE PO LIQD: 237.0000 mL | Freq: Two times a day (BID) | ORAL | Status: DC

## 2015-12-21 MED ORDER — POTASSIUM CHLORIDE CRYS ER 10 MEQ PO TBCR
10.0000 meq | EXTENDED_RELEASE_TABLET | Freq: Two times a day (BID) | ORAL | Status: AC
  Administered 2015-12-21 – 2015-12-22 (×2): 10 meq via ORAL
  Filled 2015-12-21 (×3): qty 1

## 2015-12-21 MED ORDER — CITALOPRAM HYDROBROMIDE 20 MG PO TABS
20.0000 mg | ORAL_TABLET | Freq: Every day | ORAL | Status: DC
  Administered 2015-12-22 – 2015-12-26 (×5): 20 mg via ORAL
  Filled 2015-12-21 (×9): qty 1

## 2015-12-21 NOTE — Progress Notes (Signed)
Adult Psychoeducational Group Note  Date:  12/21/2015 Time:  9:07 PM  Group Topic/Focus:  Wrap-Up Group:   The focus of this group is to help patients review their daily goal of treatment and discuss progress on daily workbooks.  Participation Level:  Active  Participation Quality:  Appropriate  Affect:  Appropriate  Cognitive:  Alert  Insight: Appropriate  Engagement in Group:  Engaged  Modes of Intervention:  Discussion  Additional Comments:  Pt stated that today has gotten better, she was able to eat dinner. She also feels like her depression is starting to decrease.   Kaleen OdeaCOOKE, Allura Doepke R 12/21/2015, 9:07 PM
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
## 2015-12-21 NOTE — Progress Notes (Signed)
Patient ID: Courtney Hansen, female   DOB: 1975/11/29, 40 y.o.   MRN: [id number] D: Client in room most of the shift, reports depression "7" of 10. 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 Rates depression "7" of 10. Goal today "try to eat" A: Writer provided emotional support encouraged client to consider at this time her role has changed and she is here for support. Medications reviewed, administered as ordered. Staff will monitor q1615min for safety. R: Client is safe on the unit.

## 2015-12-21 NOTE — Progress Notes (Signed)
NUTRITION ASSESSMENT  Pt identified as at risk on the Malnutrition Screen Tool  INTERVENTION: Staff to encourage intake of food and beverages. Supplements: Ensure Enlive po BID, each supplement provides 350 kcal and 20 grams of protein   NUTRITION DIAGNOSIS: Unintentional weight loss related to sub-optimal intake as evidenced by pt report.   Goal: Pt to meet >/= 90% of their estimated nutrition needs.  Monitor:  PO intake  Assessment:  Pt admitted with SI and depression. Reports staying in bed for 3 days straight and either sleeps too much or not at all.   40 y.o. female  Height: Ht Readings from Last 1 Encounters:  12/20/15 4' 10.25" (1.48 m)    Weight: Wt Readings from Last 1 Encounters:  12/20/15 96 lb (43.545 kg)    Weight Hx: Wt Readings from Last 10 Encounters:  12/20/15 96 lb (43.545 kg)  12/20/15 97 lb 8 oz (44.226 kg)  12/17/15 97 lb (43.999 kg)    BMI:  Body mass index is 19.88 kg/(m^2). BMI WNL  Estimated Nutritional Needs: Kcal: 25-30 kcal/kg Protein: > 1 gram protein/kg Fluid: 1 ml/kcal  Diet Order: Diet regular Room service appropriate?: Yes; Fluid consistency:: Thin Pt is also offered choice of unit snacks mid-morning and mid-afternoon.  Pt is eating as desired.   Lab results and medications reviewed.   Kendell BaneHeather Anjali Manzella RD, LDN, CNSC [phone] Pager [phone] After Hours Pager

## 2015-12-21 NOTE — [facility] Group Notes (Signed)
[facility] LCSW Group Therapy 12/21/2015 1:15pm  Type of Therapy: Group Therapy- Feelings Around Relapse and Recovery  Pt did not attend, declined invitation.   Chad CordialLauren Carter, Theresia MajorsLCSWA [phone] 12/21/2015 4:15 PM

## 2015-12-21 NOTE — Progress Notes (Signed)
[name] is settling in nicely. She takes her medications as well as asks for info on these meds ( which she is given).  She does attend her groups.  A She completed her daily assessment and on it she wrote  She denied SI today and she rated her depression, hopelessness and anxiety " 8/7/5", respectively. She is flat, blunted and worried. R Safety in place.
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
## 2015-12-21 NOTE — [facility] Group Notes (Signed)
[facility] LCSW Aftercare Discharge Planning Group Note  12/21/2015 8:45 AM  Pt did not attend, declined invitation.   Rome Schlauch Carter, LCSWA 12/21/2015 10:00AM 
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
## 2015-12-21 NOTE — Tx Team (Signed)
Interdisciplinary Treatment Plan Update (Adult) Date: 12/21/2015   Date: 12/21/2015 3:32 PM  Progress in Treatment:  Attending groups: Yes  Participating in groups: Yes  Taking medication as prescribed: Yes  Tolerating medication: Yes  Family/Significant othe contact made: No, CSW attempting to make contact with boyfriend Patient understands diagnosis: Yes AEB seeking help for depression and mood instability  Discussing patient identified problems/goals with staff: Yes  Medical problems stabilized or resolved: Yes  Denies suicidal/homicidal ideation: Yes Patient has not harmed self or Others: Yes   New problem(s) identified: None identified at this time.   Discharge Plan or Barriers: Pt will return home and follow-up with outpatient resources  Additional comments:  Patient and CSW reviewed pt's identified goals and treatment plan. Patient verbalized understanding and agreed to treatment plan. CSW reviewed [facility] "Discharge Process and Patient Involvement" Form. Pt verbalized understanding of information provided and signed form.   Reason for Continuation of Hospitalization:  Depression Medication stabilization Suicidal ideation  Estimated length of stay: 3-5 days  Review of initial/current patient goals per problem list:   1.  Goal(s): Patient will participate in aftercare plan  Met:  Yes  Target date: 3-5 days from date of admission   As evidenced by: Patient will participate within aftercare plan AEB aftercare provider and housing plan at discharge being identified.   12/21/15: Pt will return home and follow-up with outpatient resources  2.  Goal (s): Patient will exhibit decreased depressive symptoms and suicidal ideations.  Met:  No  Target date: 3-5 days from date of admission   As evidenced by: Patient will utilize self rating of depression at 3 or below and demonstrate decreased signs of depression or be deemed stable for discharge by MD. 12/21/15: Pt was admitted  with symptoms of depression, rating 10/10. Pt continues to present with flat affect and depressive symptoms.  Pt will demonstrate decreased symptoms of depression and rate depression at 3/10 or lower prior to discharge.  Attendees:  Patient:    Family:    Physician: Dr. Parke Poisson, MD  12/21/2015 3:32 PM  Nursing:   12/21/2015 3:32 PM  Clinical Social Worker Peri Maris, [place] 12/21/2015 3:32 PM  Other: Tilden Fossa, [place] 12/21/2015 3:32 PM  Clinical:  Sandre Kitty, RN; Marcella Dubs, RN 12/21/2015 3:32 PM  Other: , RN Charge Nurse 12/21/2015 3:32 PM  Other:     Peri Maris, [place] Social Work [phone]

## 2015-12-22 DIAGNOSIS — F332 Major depressive disorder, recurrent severe without psychotic features: Secondary | ICD-10-CM | POA: Insufficient documentation

## 2015-12-22 NOTE — Progress Notes (Signed)
  Courtney Hansen is quiet today. Reserved. Depressed and anxious. She takes her meds as planned and completes her daily assessment first thing this morning . She writes on it she denies SI today and she rates her depression, hopelessness and anxiety " 8/7/5", respectively.A She is encouraged to come out of her room and take part in milieu. RSAfety in place.

## 2015-12-22 NOTE — [facility] Group Notes (Signed)
[facility] LCSW Group Therapy  12/22/2015 10:00 AM  Type of Therapy:  Processing  Participation Level:  Active  Participation Quality:  Appropriate and Attentive  Affect:  Appropriate  Cognitive:  Appropriate  Insight:  Improving  Engagement in Therapy:  Engaged  Modes of Intervention:  Discussion  Summary of Progress/Problems: Group discussed systems of care. Group discussion looked at the system including self, close network and extended network. Close network included nearby or almost daily contacts. Extended network included weekly/semiweekly contact, which can also include professionals. Discussed the positives and negatives of these systems and ways to manage them. Discussed use of Thinking/Feeling/Doing triangle in decision making when mood and emotions have an impact on perception. Patient was engaged in group with few comments but was able to contribute to the conversation about supports by engaging in the ability to make appropriate decisions.   Beverly SessionsLINDSEY, Shawni Volkov J 12/22/2015, 2:04 PM

## 2015-12-22 NOTE — Progress Notes (Signed)
[facility] MD Progress Note  12/22/2015 2:25 PM Courtney Hansen  MRN:  [id number] Subjective:   Patient reports feeling " a little bit better today", although still depressed . Denies medication side effects at this time. Objective: patient seen, chart reviewed . At this time patient reports some ongoing depression, but is feeling " better" than she did prior to admission . Currently denies any suicidal ideations . She has been more visible on unit and has been going to some groups- no agitated or disruptive behaviors on unit. Remains ruminative about losses, death of father . Also, expressing increasing insight regarding how recent substance use might have exacerbated depression. Patient states she has a history of substance abuse, chaotic lifestyle in the past, but that over the last year she has made a dramatic change in her life for the better, and had been functioning better than she had in a long time- we have reviewed importance of working on sobriety, relapse prevention, as a part of treatment goals . No medication side effects at this time. Principal Problem: MDD (major depressive disorder) ([place]) Diagnosis:   Patient Active Problem List   Diagnosis Date Noted  . MDD (major depressive disorder) ([place]) [F32.9] 12/20/2015   Total Time spent with patient: 20 minutes    Past Medical History:  Past Medical History  Diagnosis Date  . Depression    History reviewed. No pertinent past surgical history. Family History:  Family History  Problem Relation Age of Onset  . Schizophrenia Brother   . Depression Brother     Social History:  History  Alcohol Use  . Yes    Comment: daily      History  Drug Use No    Social History   Social History  . Marital Status: Divorced    Spouse Name: N/A  . Number of Children: N/A  . Years of Education: N/A   Social History Main Topics  . Smoking status: Current Every Day Smoker -- 0.50 packs/day    Types: Cigarettes  . Smokeless tobacco: None   . Alcohol Use: Yes     Comment: daily   . Drug Use: No  . Sexual Activity: Yes    Birth Control/ Protection: None   Other Topics Concern  . None   Social History Narrative   Additional Social History:   Sleep: Good  Appetite:  Fair  Current Medications: Current Facility-Administered Medications  Medication Dose Route Frequency Provider Last Rate Last Dose  . acetaminophen (TYLENOL) tablet 650 mg  650 mg Oral Q6H PRN Jenne Campus, MD      . alum & mag hydroxide-simeth (MAALOX/MYLANTA) 200-200-20 MG/5ML suspension 30 mL  30 mL Oral Q4H PRN Myer Peer Cobos, MD      . citalopram (CELEXA) tablet 20 mg  20 mg Oral Daily Jenne Campus, MD   20 mg at 12/22/15 0804  . feeding supplement (ENSURE ENLIVE) (ENSURE ENLIVE) liquid 237 mL  237 mL Oral BID BM Myer Peer Cobos, MD   237 mL at 12/21/15 1000  . hydrOXYzine (ATARAX/VISTARIL) tablet 25 mg  25 mg Oral Q6H PRN Myer Peer Cobos, MD      . magnesium hydroxide (MILK OF MAGNESIA) suspension 30 mL  30 mL Oral Daily PRN Jenne Campus, MD      . traZODone (DESYREL) tablet 50 mg  50 mg Oral QHS PRN Jenne Campus, MD   50 mg at 12/21/15 2154    Lab Results:  Results for orders placed  or performed during the hospital encounter of 12/20/15 (from the past 48 hour(s))  Pregnancy, urine     Status: None   Collection Time: 12/20/15  4:05 PM  Result Value Ref Range   Preg Test, Ur NEGATIVE NEGATIVE    Comment:        THE SENSITIVITY OF THIS METHODOLOGY IS >20 mIU/mL.   Urine rapid drug screen (hosp performed)     Status: Abnormal   Collection Time: 12/20/15  4:05 PM  Result Value Ref Range   Opiates NONE DETECTED NONE DETECTED   Cocaine POSITIVE (A) NONE DETECTED   Benzodiazepines NONE DETECTED NONE DETECTED   Amphetamines NONE DETECTED NONE DETECTED   Tetrahydrocannabinol POSITIVE (A) NONE DETECTED   Barbiturates NONE DETECTED NONE DETECTED    Comment:        DRUG SCREEN FOR MEDICAL PURPOSES ONLY.  IF CONFIRMATION IS  NEEDED FOR ANY PURPOSE, NOTIFY LAB WITHIN 5 DAYS.        LOWEST DETECTABLE LIMITS FOR URINE DRUG SCREEN Drug Class       Cutoff (ng/mL) Amphetamine      1000 Barbiturate      200 Benzodiazepine   952 Tricyclics       841 Opiates          300 Cocaine          300 THC              50   Ethanol     Status: None   Collection Time: 12/20/15  4:05 PM  Result Value Ref Range   Alcohol, Ethyl (B) <5 <5 mg/dL    Comment:        LOWEST DETECTABLE LIMIT FOR SERUM ALCOHOL IS 5 mg/dL FOR MEDICAL PURPOSES ONLY   Comprehensive metabolic panel     Status: Abnormal   Collection Time: 12/20/15  4:05 PM  Result Value Ref Range   Sodium 135 135 - 145 mmol/L   Potassium 3.4 (L) 3.5 - 5.1 mmol/L   Chloride 101 101 - 111 mmol/L   CO2 29 22 - 32 mmol/L   Glucose, Bld 117 (H) 65 - 99 mg/dL   BUN 9 6 - 20 mg/dL   Creatinine, Ser 0.52 0.44 - 1.00 mg/dL   Calcium 8.8 (L) 8.9 - 10.3 mg/dL   Total Protein 6.5 6.5 - 8.1 g/dL   Albumin 4.2 3.5 - 5.0 g/dL   AST 24 15 - 41 U/L   ALT 16 14 - 54 U/L   Alkaline Phosphatase 76 38 - 126 U/L   Total Bilirubin 2.5 (H) 0.3 - 1.2 mg/dL   GFR calc non Af Amer >60 >60 mL/min   GFR calc Af Amer >60 >60 mL/min    Comment: (NOTE) The eGFR has been calculated using the CKD EPI equation. This calculation has not been validated in all clinical situations. eGFR's persistently <60 mL/min signify possible Chronic Kidney Disease.    Anion gap 5 5 - 15  CBC with Differential     Status: Abnormal   Collection Time: 12/20/15  4:05 PM  Result Value Ref Range   WBC 6.6 4.0 - 10.5 K/uL   RBC 3.86 (L) 3.87 - 5.11 MIL/uL   Hemoglobin 13.0 12.0 - 15.0 g/dL   HCT 37.5 36.0 - 46.0 %   MCV 97.2 78.0 - 100.0 fL   MCH 33.7 26.0 - 34.0 pg   MCHC 34.7 30.0 - 36.0 g/dL   RDW 12.1 11.5 - 15.5 %   Platelets  189 150 - 400 K/uL   Neutrophils Relative % 57 %   Neutro Abs 3.7 1.7 - 7.7 K/uL   Lymphocytes Relative 33 %   Lymphs Abs 2.2 0.7 - 4.0 K/uL   Monocytes Relative 9 %    Monocytes Absolute 0.6 0.1 - 1.0 K/uL   Eosinophils Relative 1 %   Eosinophils Absolute 0.1 0.0 - 0.7 K/uL   Basophils Relative 0 %   Basophils Absolute 0.0 0.0 - 0.1 K/uL    Blood Alcohol level:  Lab Results  Component Value Date   ETH <5 12/20/2015   ETH 202* 12/17/2015    Physical Findings: AIMS: Facial and Oral Movements Muscles of Facial Expression: None, normal Lips and Perioral Area: None, normal Jaw: None, normal Tongue: None, normal,Extremity Movements Upper (arms, wrists, hands, fingers): None, normal Lower (legs, knees, ankles, toes): None, normal, Trunk Movements Neck, shoulders, hips: None, normal, Overall Severity Severity of abnormal movements (highest score from questions above): None, normal Incapacitation due to abnormal movements: None, normal Patient's awareness of abnormal movements (rate only patient's report): No Awareness, Dental Status Current problems with teeth and/or dentures?: No Does patient usually wear dentures?: No  CIWA:    COWS:     Musculoskeletal: Strength & Muscle Tone: within normal limits Gait & Station: normal Patient leans: N/A  Psychiatric Specialty Exam: ROS denies headache, denies chest pain, denies shortness of breath  Blood pressure 110/83, pulse 64, temperature 97.9 F (36.6 C), temperature source Oral, resp. rate 16, height 4' 10.25" (1.48 m), weight 96 lb (43.545 kg), last menstrual period 12/06/2015.Body mass index is 19.88 kg/(m^2).  General Appearance: Well Groomed  Engineer, water::  Good  Speech:  Normal Rate  Volume:  Normal  Mood:  still depressed, but improved mood compared to admission   Affect:  constricted , mildy anxious, but reactive   Thought Process:  Linear  Orientation:  Full (Time, Place, and Person)  Thought Content:  denies halluncinations, no delusions , not internally preoccupied   Suicidal Thoughts:  No at this time denies any suicidal ideations, denies any self injurious ideations, denies  homicidal  ideations  Homicidal Thoughts:  No  Memory:  recent and remote grossly intact   Judgement:   Improving   Insight:  Improving   Psychomotor Activity:  Normal  Concentration:  Good  Recall:  Good  Fund of Knowledge:Good  Language: Good  Akathisia:  Negative  Handed:  Right  AIMS (if indicated):     Assets:  Communication Skills Desire for Improvement Resilience  ADL's:  Intact  Cognition: WNL  Sleep:  Number of Hours: 6.5  Assessment - patient partially improved compared to admission presentation. Remains depressed, but affect more reactive, and no SI at this time. Tolerating medications well . Gaining insight regarding the importance of focusing on substance abuse, sobriety/ recovery. Tolerating Celexa well at this time. Treatment Plan Summary: Daily contact with patient to assess and evaluate symptoms and progress in treatment, Medication management, Plan inpatient admission and medications as below  Encourage  group participation and milieu activities to work on coping skills and symptom reduction Continue to encourage recovery, substance abstinence efforts  Continue Celexa 20 mgrs QAM for depression and anxiety Continue Hydroxyzine 25 mgrs Q 6 hours PRN for anxiety as needed Continue Trazodone 50 mgrs QHS PRN for insomnia  Neita Garnet, MD 12/22/2015, 2:25 PM

## 2015-12-22 NOTE — [facility] Group Notes (Signed)
[facility] Group Notes:  (Nursing/MHT/Case Management/Adjunct)  Date:  12/22/2015  Time:  1315  Type of Therapy:  Nurse Education  /  Life SKills:  The group is focused on teaching patients how to identify their needs as well as how to get their needs met.  Participation Level:  Did Not Attend  Participation Quality:  n/a  Affect:  n/a  Cognitive:  n/a  Insight:n/a    Engagement in Group:  n/a  Modes of Intervention: n/.a   Summary of Progress/Problems:  Lauralyn Primes 12/22/2015, 3:16 PM

## 2015-12-22 NOTE — Progress Notes (Signed)
Courtney Hansen is out inLynden Ang the milieu this morning. She makes poor eye contact. She is quiet. She says she feels uncomfortable in the dayroom. She completed her daily assessment and on it she wrote she denied SI today and she rated her depression, hopelessness and anxiety " 8/7/5", respectively. She says she is worried about how long she will be hospitalized. ... A She is encouraged to stay  " in the moment.Marland Kitchen.to  focus on her issues". She accepts this positive encouragement and returned to  Her room, turned off the light and got back into her bed. R Safety is in place.

## 2015-12-22 NOTE — Progress Notes (Signed)
D.  Pt with flat depressed affect on approach, in room.  Positive for evening wrap up group, minimal interaction on unit. Pt has remained in room reading most of shift prior to bed.  Pt denies SI/HI/hallucinatoins at this time.  A.  Support and encouragement offered  R. Pt remains safe on the unit, will continue to monitor.

## 2015-12-23 MED ORDER — TRAZODONE HCL 100 MG PO TABS
100.0000 mg | ORAL_TABLET | Freq: Every evening | ORAL | Status: DC | PRN
  Administered 2015-12-23 – 2015-12-25 (×3): 100 mg via ORAL
  Filled 2015-12-23 (×3): qty 1

## 2015-12-23 NOTE — [facility] Group Notes (Signed)
[facility] LCSW Group Therapy Note   12/23/2015  10 AM   Type of Therapy and Topic: Group Therapy: Feelings Around Returning Home & Establishing a Supportive Framework and Discussion of Strength and Vuneralbility  Participation Level: Did not attend   Carney Bernatherine C Harrill, LCSW

## 2015-12-23 NOTE — Progress Notes (Signed)
Adult Psychoeducational Group Note  Date:  12/23/2015 Time:  8:54 PM  Group Topic/Focus:  Wrap-Up Group:   The focus of this group is to help patients review their daily goal of treatment and discuss progress on daily workbooks.  Participation Level:  Active  Participation Quality:  Appropriate and Attentive  Affect:  Appropriate  Cognitive:  Appropriate  Insight: Appropriate  Engagement in Group:  Engaged  Modes of Intervention:  Discussion  Additional Comments:  Pt stated her goal for today was to not over think, which she did not accomplish. Encouragement and support were provided. Pt stated one positive thing for today is that she is starting to feel better.  Caswell CorwinOwen, Trusten Hume C 12/23/2015, 8:54 PM

## 2015-12-23 NOTE — Progress Notes (Signed)
Patient ID: Courtney Hansen, female   DOB: 04-Jun-1976, 40 y.o.   [id number] [facility]  12/23/2015 12:28 PM Courtney Hansen  MRN:  [id number] Subjective:   Reports she is feeling " a little better", but is still depressed, and feeling like she needs some " rest to recharge myself ". States she slept poorly last night due to tendency to ruminate about stressors . Denies any suicidal ideations.Denies medication side effects at this time. Objective: patient seen, chart reviewed . Remains somewhat depressed and affect , although reactive, remains constricted. No thought disorder, no SI.  Future oriented, and ruminative about how long she will be in hospital and how soon she can return to work. We again discussed importance of abstinence from substances, alcohol as part of treatment goals . Tends to be isolative, some group participation but limited interaction with peers. Principal Problem: MDD (major depressive disorder) ([facility]) Diagnosis:   Patient Active Problem List   Diagnosis Date Noted  . Severe episode of recurrent major depressive disorder, without psychotic features ([facility]) [F33.2]   . MDD (major depressive disorder) ([facility]) [F32.9] 12/20/2015   Total Time spent with patient: 20 minutes    Past Medical History:  Past Medical History  Diagnosis Date  . Depression    History reviewed. No pertinent past surgical history. Family History:  Family History  Problem Relation Age of Onset  . Schizophrenia Brother   . Depression Brother     Social History:  History  Alcohol Use  . Yes    Comment: daily      History  Drug Use No    Social History   Social History  . Marital Status: Divorced    Spouse Name: N/A  . Number of Children: N/A  . Years of Education: N/A   Social History Main Topics  . Smoking status: Current Every Day Smoker -- 0.50 packs/day    Types: Cigarettes  . Smokeless tobacco: None  . Alcohol Use: Yes     Comment: daily   . Drug Use: No  .  Sexual Activity: Yes    Birth Control/ Protection: None   Other Topics Concern  . None   Social History Narrative   Additional Social History:   Sleep: fair   Appetite:   Improved   Current Medications: Current Facility-Administered Medications  Medication Dose Route Frequency Provider Last Rate Last Dose  . acetaminophen (TYLENOL) tablet 650 mg  650 mg Oral Q6H PRN Craige CottaFernando A Samuell Knoble, MD      . alum & mag hydroxide-simeth (MAALOX/MYLANTA) 200-200-20 MG/5ML suspension 30 mL  30 mL Oral Q4H PRN Rockey SituFernando A Ama Mcmaster, MD      . citalopram (CELEXA) tablet 20 mg  20 mg Oral Daily Craige CottaFernando A Tyria Springer, MD   20 mg at 12/23/15 [id number]  . feeding supplement (ENSURE ENLIVE) (ENSURE ENLIVE) liquid 237 mL  237 mL Oral BID BM Rockey SituFernando A Jazilyn Siegenthaler, MD   237 mL at 12/21/15 1000  . hydrOXYzine (ATARAX/VISTARIL) tablet 25 mg  25 mg Oral Q6H PRN Rockey SituFernando A Vollie Aaron, MD      . magnesium hydroxide (MILK OF MAGNESIA) suspension 30 mL  30 mL Oral Daily PRN Craige CottaFernando A Reine Bristow, MD      . traZODone (DESYREL) tablet 50 mg  50 mg Oral QHS PRN Craige CottaFernando A Joyceline Maiorino, MD   50 mg at 12/22/15 2104    Lab Results:  No results found for this or any previous visit (from the past 48 hour(s)).  Blood Alcohol level:  Lab Results  Component Value Date   ETH <5 12/20/2015   ETH 202* 12/17/2015    Physical Findings: AIMS: Facial and Oral Movements Muscles of Facial Expression: None, normal Lips and Perioral Area: None, normal Jaw: None, normal Tongue: None, normal,Extremity Movements Upper (arms, wrists, hands, fingers): None, normal Lower (legs, knees, ankles, toes): None, normal, Trunk Movements Neck, shoulders, hips: None, normal, Overall Severity Severity of abnormal movements (highest score from questions above): None, normal Incapacitation due to abnormal movements: None, normal Patient's awareness of abnormal movements (rate only patient's report): No Awareness, Dental Status Current problems with teeth and/or dentures?:  No Does patient usually wear dentures?: No  CIWA:    COWS:     Musculoskeletal: Strength & Muscle Tone: within normal limits Gait & Station: normal Patient leans: N/A  Psychiatric Specialty Exam: ROS denies headache, denies chest pain, denies shortness of breath  Blood pressure 121/61, pulse 62, temperature 97.9 F (36.6 C), temperature source Oral, resp. rate 18, height 4' 10.25" (1.48 m), weight 96 lb (43.545 kg), last menstrual period 12/06/2015.Body mass index is 19.88 kg/(m^2).  General Appearance: Well Groomed  Patent attorney::  Good  Speech:  Normal Rate  Volume:  Normal  Mood: depressed, gradual improvement   Affect:  constricted , mildy anxious, but reactive   Thought Process:  Linear  Orientation:  Full (Time, Place, and Person)  Thought Content:  denies halluncinations, no delusions , not internally preoccupied   Suicidal Thoughts:  No at this time denies any suicidal ideations, denies any self injurious ideations, denies  homicidal ideations  Homicidal Thoughts:  No  Memory:  recent and remote grossly intact   Judgement:   Improving   Insight:  Improving   Psychomotor Activity:  Decreased   Concentration:  Good  Recall:  Good  Fund of Knowledge:Good  Language: Good  Akathisia:  Negative  Handed:  Right  AIMS (if indicated):     Assets:  Communication Skills Desire for Improvement Resilience  ADL's:  Intact  Cognition: WNL  Sleep:  Number of Hours: 6.75  Assessment - remains depressed, ruminative, but has improved partially compared to admission,denies any suicidal ideations. Tolerating Celexa well at this time. Reports ongoing insomnia. Treatment Plan Summary: Daily contact with patient to assess and evaluate symptoms and progress in treatment, Medication management, Plan inpatient admission and medications as below  Encourage  group participation and milieu activities to work on coping skills and symptom reduction Continue to encourage recovery, substance  abstinence efforts  Continue Celexa 20 mgrs QAM for depression and anxiety Continue Hydroxyzine 25 mgrs Q 6 hours PRN for anxiety as needed Increase Trazodone to 100  mgrs QHS PRN for  ongoing insomnia  Nehemiah Massed, MD 12/23/2015, 12:28 PM

## 2015-12-23 NOTE — Plan of Care (Signed)
Problem: Ineffective individual coping Goal: STG: Patient will remain free from self harm Outcome: Progressing Pt has remained free from self harm     

## 2015-12-23 NOTE — Progress Notes (Signed)
D.  Pt presents with flat affect but is pleasant on approach.  Complaint of feeling anxious and a bit tremulous since starting on Celexa.  Stated that she had a difficult time sleeping last night due to ruminating thoughts.  Pt denies SI/HI/hallucinations at this time.  Pt was positive for evening wrap up group, interacting appropriately with peers on unit.  A.  Support and encouragement offered  Offered Vistaril with nightly Trazodone with hopes of improving sleep tonight.   R.  Pt remains safe on the unit, will continue to monitor.

## 2015-12-24 MED ORDER — METRONIDAZOLE 500 MG PO TABS
500.0000 mg | ORAL_TABLET | Freq: Two times a day (BID) | ORAL | Status: DC
  Administered 2015-12-24 – 2015-12-26 (×4): 500 mg via ORAL
  Filled 2015-12-24 (×8): qty 1

## 2015-12-24 NOTE — Progress Notes (Signed)
Patient ID: Courtney Hansen, female   DOB: 04/18/1976, 40 y.o.   MRN: [id number]  Pt currently presents with a flat affect and guarded behavior. Per self inventory, pt rates depression at a 7, hopelessness 5 and anxiety 5. Pt's daily goal is to "not over think" and they intend to do so by "not over think." Pt reports good sleep, a fair appetite, low energy and poor concentration.   Pt provided with medications per providers orders. Pt's labs and vitals were monitored throughout the day. Pt supported emotionally and encouraged to express concerns and questions. Pt educated on medications.  Pt's safety ensured with 15 minute and environmental checks. Pt currently denies SI/HI and A/V hallucinations. Pt verbally agrees to seek staff if SI/HI or A/VH occurs and to consult with staff before acting on these thoughts. Will continue POC.

## 2015-12-24 NOTE — [facility] Group Notes (Signed)
[facility] Group Notes:  (Nursing/MHT/Case Management/Adjunct)  Date:  12/24/2015  Time:  8:46 PM  Type of Therapy:  Psychoeducational Skills  Participation Level:  Active  Participation Quality:  Appropriate and Attentive  Affect:  Appropriate  Cognitive:  Alert and Appropriate  Insight:  Appropriate  Engagement in Group:  Engaged  Modes of Intervention:  Discussion and Education  Summary of Progress/Problems:  Pt participated in wrap up group. Pt rated her day a 6/10. Pt said today has been the her best day here so far. Pt said she was out of bed more than usual, tried to attend groups, and be more social. Pt's goal today was not to over think. Pt said she thinks she met her goal because she tried to stay out of bed and keep herself distracted. Pt went outside and played basketball. Pt's goal tomorrow is to stay out of bed all day and participate in groups.   Lita Mains 12/24/2015, 8:46 PM

## 2015-12-24 NOTE — [facility] Suicide Risk Assessment (Signed)
[facility] INPATIENT:  Family/Significant Other Suicide Prevention Education  Suicide Prevention Education:  Education Completed; Courtney MurphyMatthew Hansen, Pt's husband [phone],  has been identified by the patient as the family member/significant other with whom the patient will be residing, and identified as the person(s) who will aid the patient in the event of a mental health crisis (suicidal ideations/suicide attempt).  With written consent from the patient, the family member/significant other has been provided the following suicide prevention education, prior to the and/or following the discharge of the patient.  The suicide prevention education provided includes the following:  Suicide risk factors  Suicide prevention and interventions  National Suicide Hotline telephone number  [facility] assessment telephone number  [facility] Assistance 911  [facility] and/or [facility]idential [facility] Crisis [facility] telephone number  Request made of family/significant other to:  Remove weapons (e.g., guns, rifles, knives), all items previously/currently identified as safety concern.    Remove drugs/medications (over-the-counter, prescriptions, illicit drugs), all items previously/currently identified as a safety concern.  The family member/significant other verbalizes understanding of the suicide prevention education information provided.  The family member/significant other agrees to remove the items of safety concern listed above.  Courtney HoopsCarter, Courtney Hansen M 12/24/2015, 3:16 PM

## 2015-12-24 NOTE — Progress Notes (Signed)
Patient ID: Courtney Hansen, female   DOB: Oct 04, 1975, 40 y.o.   MRN: [id number] [facility] MD Progress Note  12/24/2015 4:09 PM Courtney Hansen  MRN:  [id number] Subjective:   Reports some overall improvement , but reports ongoing subjective sense of depression, vague anxiety. Denies suicidal ideations. States she feels Celexa trial is helping, but states she feels it makes her feel vaguely " shaky, jittery".   Objective: patient seen, chart reviewed . Patient presents with gradually improving mood and range of affect, remains somewhat depressed, but denies suicidal ideations and is future oriented, ruminative about when she will return to work after discharge. As above, feels Celexa trial is helping, but causing some vague side effect of feeling " jittery". At this time no tremors noted, no akathisia, no psychomotor agitation or restlessness . We discussed options, to include switching to another antidepressant trial, decreasing dose, or continuing to monitor- patient opted to continue current dose, as she states symptoms are vague and may already be decreasing . She is future oriented, looking forward to discharging home soon, but ruminative and anxious about work, and how to transition back to it. Responds well to support, empathy, and affect tends to improve during session. More visible on unit, going to some groups , although participation is limited .  Principal Problem: MDD (major depressive disorder) (HCC) Diagnosis:   Patient Active Problem List   Diagnosis Date Noted  . Severe episode of recurrent major depressive disorder, without psychotic features (HCC) [F33.2]   . MDD (major depressive disorder) (HCC) [F32.9] 12/20/2015   Total Time spent with patient: 20 minutes    Past Medical History:  Past Medical History  Diagnosis Date  . Depression    History reviewed. No pertinent past surgical history. Family History:  Family History  Problem Relation Age of Onset  . Schizophrenia  Brother   . Depression Brother     Social History:  History  Alcohol Use  . Yes    Comment: daily      History  Drug Use No    Social History   Social History  . Marital Status: Divorced    Spouse Name: N/A  . Number of Children: N/A  . Years of Education: N/A   Social History Main Topics  . Smoking status: Current Every Day Smoker -- 0.50 packs/day    Types: Cigarettes  . Smokeless tobacco: None  . Alcohol Use: Yes     Comment: daily   . Drug Use: No  . Sexual Activity: Yes    Birth Control/ Protection: None   Other Topics Concern  . None   Social History Narrative   Additional Social History:   Sleep:  Improved  Appetite:   Improved   Current Medications: Current Facility-Administered Medications  Medication Dose Route Frequency Provider Last Rate Last Dose  . acetaminophen (TYLENOL) tablet 650 mg  650 mg Oral Q6H PRN Craige Cotta, MD      . alum & mag hydroxide-simeth (MAALOX/MYLANTA) 200-200-20 MG/5ML suspension 30 mL  30 mL Oral Q4H PRN Rockey Situ Cobos, MD      . citalopram (CELEXA) tablet 20 mg  20 mg Oral Daily Craige Cotta, MD   20 mg at 12/24/15 0857  . feeding supplement (ENSURE ENLIVE) (ENSURE ENLIVE) liquid 237 mL  237 mL Oral BID BM Rockey Situ Cobos, MD   237 mL at 12/21/15 1000  . hydrOXYzine (ATARAX/VISTARIL) tablet 25 mg  25 mg Oral Q6H PRN Craige Cotta, MD  25 mg at 12/23/15 2103  . magnesium hydroxide (MILK OF MAGNESIA) suspension 30 mL  30 mL Oral Daily PRN Rockey SituFernando A Cobos, MD      . metroNIDAZOLE (FLAGYL) tablet 500 mg  500 mg Oral Q12H Sanjuana KavaAgnes I Nwoko, NP      . traZODone (DESYREL) tablet 100 mg  100 mg Oral QHS PRN Craige CottaFernando A Cobos, MD   100 mg at 12/23/15 2103    Lab Results:  No results found for this or any previous visit (from the past 48 hour(s)).  Blood Alcohol level:  Lab Results  Component Value Date   ETH <5 12/20/2015   ETH 202* 12/17/2015    Physical Findings: AIMS: Facial and Oral Movements Muscles of  Facial Expression: None, normal Lips and Perioral Area: None, normal Jaw: None, normal Tongue: None, normal,Extremity Movements Upper (arms, wrists, hands, fingers): None, normal Lower (legs, knees, ankles, toes): None, normal, Trunk Movements Neck, shoulders, hips: None, normal, Overall Severity Severity of abnormal movements (highest score from questions above): None, normal Incapacitation due to abnormal movements: None, normal Patient's awareness of abnormal movements (rate only patient's report): No Awareness, Dental Status Current problems with teeth and/or dentures?: No Does patient usually wear dentures?: No  CIWA:    COWS:     Musculoskeletal: Strength & Muscle Tone: within normal limits Gait & Station: normal Patient leans: N/A  Psychiatric Specialty Exam: ROS denies headache, denies chest pain, denies shortness of breath  Blood pressure 114/70, pulse 61, temperature 98.7 F (37.1 C), temperature source Oral, resp. rate 16, height 4' 10.25" (1.48 m), weight 96 lb (43.545 kg), last menstrual period 12/06/2015.Body mass index is 19.88 kg/(m^2).  General Appearance: Well Groomed  Patent attorneyye Contact::  Good  Speech:  Normal Rate  Volume:  Normal  Mood: remains depressed, but states she is feeling better than on admission  Affect:  Still constricted, but more reactive, does smile at times appropriately   Thought Process:  Linear  Orientation:  Full (Time, Place, and Person)  Thought Content:  denies halluncinations, no delusions , not internally preoccupied   Suicidal Thoughts:  No at this time denies any suicidal ideations, denies any self injurious ideations, denies  homicidal ideations  Homicidal Thoughts:  No  Memory:  recent and remote grossly intact   Judgement:   Improving   Insight:  Improving   Psychomotor Activity:  Normal, more visible on unit   Concentration:  Good  Recall:  Good  Fund of Knowledge:Good  Language: Good  Akathisia:  Negative  Handed:  Right  AIMS  (if indicated):     Assets:  Communication Skills Desire for Improvement Resilience  ADL's:  Intact  Cognition: WNL  Sleep:  Number of Hours: 6.75  Assessment - reports partial improvement of mood, but remains depressed, somewhat constricted in affect. Not endorsing any suicidal ideations, and less isolative than on admission . She states she feels Celexa is helping, but also causing her to feel vaguely jittery. We discussed options and she prefers to continue current dose, and monitor for side effects.  Treatment Plan Summary: Daily contact with patient to assess and evaluate symptoms and progress in treatment, Medication management, Plan inpatient admission and medications as below  Encourage  group participation and milieu activities to work on coping skills and symptom reduction Continue to encourage recovery, substance abstinence efforts  Continue Celexa 20 mgrs QAM for depression and anxiety Continue Hydroxyzine 25 mgrs Q 6 hours PRN for anxiety as needed Continue  Trazodone  100  mgrs QHS PRN for  ongoing insomnia  Treatment team working on disposition options  Nehemiah Massed, MD 12/24/2015, 4:09 PM

## 2015-12-24 NOTE — Plan of Care (Signed)
Problem: Diagnosis: Increased Risk For Suicide Attempt Goal: STG-Patient Will Attend All Groups On The Unit Outcome: Progressing Pt attended group     

## 2015-12-24 NOTE — [facility] Group Notes (Signed)
[facility] LCSW Group Therapy  12/24/2015 1:15pm  Type of Therapy:  Group Therapy vercoming Obstacles  Participation Level:  Minimal  Participation Quality:  Reserved but Attentive  Affect:  Flat  Cognitive:  Appropriate and Oriented  Insight:  Developing/Improving and Improving  Engagement in Therapy:  Improving  Modes of Intervention:  Discussion, Exploration, Problem-solving and Support  Description of Group:   In this group patients will be encouraged to explore what they see as obstacles to their own wellness and recovery. They will be guided to discuss their thoughts, feelings, and behaviors related to these obstacles. The group will process together ways to cope with barriers, with attention given to specific choices patients can make. Each patient will be challenged to identify changes they are motivated to make in order to overcome their obstacles. This group will be process-oriented, with patients participating in exploration of their own experiences as well as giving and receiving support and challenge from other group members.  Summary of Patient Progress: Pt was reserved in group discussion. She identified emotional wellness as a goal she has for herself in the next year. She discussed how this hospitalization experience was new for her as well as the recovery process.   Therapeutic Modalities:   Cognitive Behavioral Therapy Solution Focused Therapy Motivational Interviewing Relapse Prevention Therapy   Chad CordialLauren Carter, LCSWA 12/24/2015 2:22 PM

## 2015-12-24 NOTE — [facility] Group Notes (Signed)
[facility]  12/24/2015 8:45 AM  Pt did not attend, declined invitation.   Chad CordialLauren Carter, LCSWA 12/24/2015 9:45 AM

## 2015-12-25 NOTE — [facility] Group Notes (Signed)
[facility] LCSW Group Therapy 12/25/2015 1:15 PM  Type of Therapy: Group Therapy- Feelings about Diagnosis  Participation Level: Active   Participation Quality:  Appropriate  Affect:  Appropriate  Cognitive: Alert and Oriented   Insight:  Developing   Engagement in Therapy: Developing/Improving and Engaged   Modes of Intervention: Clarification, Confrontation, Discussion, Education, Exploration, Limit-setting, Orientation, Problem-solving, Rapport Building, Dance movement psychotherapisteality Testing, Socialization and Support  Description of Group:   This group will allow patients to explore their thoughts and feelings about diagnoses they have received. Patients will be guided to explore their level of understanding and acceptance of these diagnoses. Facilitator will encourage patients to process their thoughts and feelings about the reactions of others to their diagnosis, and will guide patients in identifying ways to discuss their diagnosis with significant others in their lives. This group will be process-oriented, with patients participating in exploration of their own experiences as well as giving and receiving support and challenge from other group members.  Summary of Progress/Problems:  Pt reports that this is her first time being hospitalized; however she describes feeling relieved as she is finally addressing her emotional issues that she has been dealing with her entire life. Pt reports that she feels that she is able to create a treatment plan and follow it now that she has acknowledged her need for treatment.  Therapeutic Modalities:   Cognitive Behavioral Therapy Solution Focused Therapy Motivational Interviewing Relapse Prevention Therapy  Chad CordialLauren Carter, LCSWA 12/25/2015 2:25 PM

## 2015-12-25 NOTE — Progress Notes (Signed)
Patient ID: Courtney Hansen, female   DOB: 14-Sep-1975, 40 y.o.   MRN: [id number]   Pt currently presents with a flat affect and cooperative behavior. Per self inventory, pt rates depression at a 7, hopelessness 5 and anxiety 5. Pt's daily goal is to "stay awake" and they intend to do so by "stay awake." Pt reports fair sleep, a fair appetite, low energy and poor concentration.   Pt provided with medications per providers orders. Pt's labs and vitals were monitored throughout the day. Pt supported emotionally and encouraged to express concerns and questions. Pt educated on medications, PTSD and alternative nausea relief.   Pt's safety ensured with 15 minute and environmental checks. Pt currently denies SI/HI and A/V hallucinations. Pt verbally agrees to seek staff if SI/HI or A/VH occurs and to consult with staff before acting on these thoughts. Will continue POC.

## 2015-12-25 NOTE — Progress Notes (Signed)
Patient ID: Courtney Hansen, female   DOB: 1976/09/08, 40 y.o.   MRN: [id number]  Adult Psychoeducational Group Note  Date:  12/25/2015 Time: 08:45am  Group Topic/Focus:  Self Esteem Action Plan:   The focus of this group is to help patients create a plan to continue to build self-esteem after discharge.  Participation Level:  Did Not Attend  Participation Quality:  n/a  Affect: n/a  Cognitive:  n/a  Insight: n/a  Engagement in Group: n/a  Modes of Intervention:  Activity, Discussion, Education and Support  Additional Comments:  Pt chose not to attend group today, pt in bed asleep.   Aurora Maskwyman, Jameel Quant E 12/25/2015, 10:15 AM

## 2015-12-25 NOTE — Progress Notes (Signed)
[facility] MD Progress Note  12/25/2015 3:28 PM Courtney Hansen  MRN:  [id number] Subjective:  Works in [employer]. Got really depressed and asked for help. States the depression has been going on for a while but because of her work she was self conscious as far as admitting to it. States that she had some increased anxiety in the beginning when started taking it but that this is going away. She is willing to keep taking it Principal Problem: MDD (major depressive disorder) (HCC) Diagnosis:   Patient Active Problem List   Diagnosis Date Noted  . Severe episode of recurrent major depressive disorder, without psychotic features (HCC) [F33.2]   . MDD (major depressive disorder) (HCC) [F32.9] 12/20/2015   Total Time spent with patient: 20 minutes  Past Psychiatric History: see admission H and P  Past Medical History:  Past Medical History  Diagnosis Date  . Depression    History reviewed. No pertinent past surgical history. Family History:  Family History  Problem Relation Age of Onset  . Schizophrenia Brother   . Depression Brother    Family Psychiatric  History: see admission H and P Social History:  History  Alcohol Use  . Yes    Comment: daily      History  Drug Use No    Social History   Social History  . Marital Status: Divorced    Spouse Name: N/A  . Number of Children: N/A  . Years of Education: N/A   Social History Main Topics  . Smoking status: Current Every Day Smoker -- 0.50 packs/day    Types: Cigarettes  . Smokeless tobacco: None  . Alcohol Use: Yes     Comment: daily   . Drug Use: No  . Sexual Activity: Yes    Birth Control/ Protection: None   Other Topics Concern  . None   Social History Narrative   Additional Social History:                         Sleep: Fair  Appetite:  Fair  Current Medications: Current Facility-Administered Medications  Medication Dose Route Frequency Provider Last Rate Last Dose  . acetaminophen (TYLENOL)  tablet 650 mg  650 mg Oral Q6H PRN Craige Cotta, MD      . alum & mag hydroxide-simeth (MAALOX/MYLANTA) 200-200-20 MG/5ML suspension 30 mL  30 mL Oral Q4H PRN Rockey Situ Cobos, MD      . citalopram (CELEXA) tablet 20 mg  20 mg Oral Daily Craige Cotta, MD   20 mg at 12/25/15 0807  . feeding supplement (ENSURE ENLIVE) (ENSURE ENLIVE) liquid 237 mL  237 mL Oral BID BM Rockey Situ Cobos, MD   237 mL at 12/21/15 1000  . hydrOXYzine (ATARAX/VISTARIL) tablet 25 mg  25 mg Oral Q6H PRN Craige Cotta, MD   25 mg at 12/24/15 2154  . magnesium hydroxide (MILK OF MAGNESIA) suspension 30 mL  30 mL Oral Daily PRN Rockey Situ Cobos, MD      . metroNIDAZOLE (FLAGYL) tablet 500 mg  500 mg Oral Q12H Sanjuana Kava, NP   500 mg at 12/25/15 0807  . traZODone (DESYREL) tablet 100 mg  100 mg Oral QHS PRN Craige Cotta, MD   100 mg at 12/24/15 2154    Lab Results: No results found for this or any previous visit (from the past 48 hour(s)).  Blood Alcohol level:  Lab Results  Component Value Date  ETH <5 12/20/2015   ETH 202* 12/17/2015    Physical Findings: AIMS: Facial and Oral Movements Muscles of Facial Expression: None, normal Lips and Perioral Area: None, normal Jaw: None, normal Tongue: None, normal,Extremity Movements Upper (arms, wrists, hands, fingers): None, normal Lower (legs, knees, ankles, toes): None, normal, Trunk Movements Neck, shoulders, hips: None, normal, Overall Severity Severity of abnormal movements (highest score from questions above): None, normal Incapacitation due to abnormal movements: None, normal Patient's awareness of abnormal movements (rate only patient's report): No Awareness, Dental Status Current problems with teeth and/or dentures?: No Does patient usually wear dentures?: No  CIWA:    COWS:     Musculoskeletal: Strength & Muscle Tone: within normal limits Gait & Station: normal Patient leans: normal  Psychiatric Specialty Exam: Review of Systems   Constitutional: Negative.   HENT: Negative.   Eyes: Negative.   Respiratory: Negative.   Cardiovascular: Negative.   Gastrointestinal: Negative.   Genitourinary: Negative.   Musculoskeletal: Negative.   Skin: Negative.   Neurological: Negative.   Endo/Heme/Allergies: Negative.   Psychiatric/Behavioral: Positive for depression.    Blood pressure 137/77, pulse 58, temperature 98.4 F (36.9 C), temperature source Oral, resp. rate 14, height 4' 10.25" (1.48 m), weight 43.545 kg (96 lb), last menstrual period 12/06/2015.Body mass index is 19.88 kg/(m^2).  General Appearance: Fairly Groomed  Patent attorneyye Contact::  Fair  Speech:  Clear and Coherent  Volume:  Decreased  Mood:  Anxious and Depressed  Affect:  anxious worried  Thought Process:  Coherent and Goal Directed  Orientation:  Full (Time, Place, and Person)  Thought Content:  symptoms events worries concerns  Suicidal Thoughts:  No  Homicidal Thoughts:  No  Memory:  Immediate;   Fair Recent;   Fair Remote;   Fair  Judgement:  Fair  Insight:  Present  Psychomotor Activity:  Normal  Concentration:  Fair  Recall:  FiservFair  Fund of Knowledge:Fair  Language: Fair  Akathisia:  No  Handed:  Right  AIMS (if indicated):     Assets:  Desire for Improvement Housing Social Support Vocational/Educational  ADL's:  Intact  Cognition: WNL  Sleep:  Number of Hours: 6   Treatment Plan Summary: Daily contact with patient to assess and evaluate symptoms and progress in treatment and Medication management Supportive approach/coping skills Depression; continue the Celexa work to optimize dose response Insomnia; trazodone 100 mg HS PRN sleep Work with CBT/mindfulness Rachael FeeLUGO,Raveena Hebdon A, MD 12/25/2015, 3:28 PM

## 2015-12-25 NOTE — Plan of Care (Signed)
Problem: Alteration in mood Goal: LTG-Patient reports reduction in suicidal thoughts (Patient reports reduction in suicidal thoughts and is able to verbalize a safety plan for whenever patient is feeling suicidal)  Outcome: Progressing Pt denies SI at this time     

## 2015-12-25 NOTE — Progress Notes (Signed)
D: Pt denies SI/HI/AVH. Pt is pleasant and cooperative. Pt stated she was doing better, she has been up on the unit interacting with peers, ut of the bed and not just lying around.   A: Pt was offered support and encouragement. Pt was given scheduled medications. Pt was encourage to attend groups. Q 15 minute checks were done for safety.   R:Pt attends groups and interacts well with peers and staff. Pt is taking medication. Pt has no complaints.Pt receptive to treatment and safety maintained on unit.

## 2015-12-25 NOTE — Progress Notes (Signed)
Recreation Therapy Notes  Animal-Assisted Activity (AAA) Program Checklist/Progress Notes Patient Eligibility Criteria Checklist & Daily Group note for Rec Tx Intervention  Date: 12/25/15 Time: 1445 [address]  AAA/T Program Assumption of Risk Form signed by Patient/ or Parent Legal Guardian yes  Patient is free of allergies or sever asthma yes  Patient reports no fear of animals yes  Patient reports no history of cruelty to animalsyes  Patient understands his/her participation is voluntary yes  Patient washes hands before animal contact yes  Patient washes hands after animal contact yes  Behavioral Response:  Attentive  Education: Hand Washing, Appropriate Animal Interaction   Education Outcome: Acknowledges understanding/In group clarification offered/Needs additional education.   Clinical Observations/Feedback: Pt observed peers interacting with dog.  Pt left early.   Caroll RancherMarjette Malosi Hemstreet, LRT/CTRS  Caroll RancherLindsay, Mahamadou Weltz A 12/25/2015 4:04 PM

## 2015-12-25 NOTE — Plan of Care (Signed)
Problem: Alteration in mood Goal: STG-Patient is able to discuss feelings and issues (Patient is able to discuss feelings and issues leading to depression)  Outcome: Progressing Pt discusses fears of being a "role model" to others with mental illness after discharge.

## 2015-12-26 MED ORDER — CITALOPRAM HYDROBROMIDE 20 MG PO TABS: 20.0000 mg | ORAL_TABLET | Freq: Every day | ORAL | Status: DC

## 2015-12-26 MED ORDER — HYDROXYZINE HCL 25 MG PO TABS: 25.0000 mg | ORAL_TABLET | Freq: Four times a day (QID) | ORAL | Status: DC | PRN

## 2015-12-26 MED ORDER — TRAZODONE HCL 100 MG PO TABS: 100.0000 mg | ORAL_TABLET | Freq: Every evening | ORAL | Status: DC | PRN

## 2015-12-26 MED ORDER — METRONIDAZOLE 500 MG PO TABS: 500.0000 mg | ORAL_TABLET | Freq: Two times a day (BID) | ORAL | Status: DC

## 2015-12-26 NOTE — [facility] Group Notes (Signed)
[facility] LCSW Aftercare Discharge Planning Group Note  12/26/2015  8:45 AM  Participation Quality: Did Not Attend. Patient invited to participate but declined.  Gerry Blanchfield, MSW, LCSW Clinical Social Worker [facility] [phone]   

## 2015-12-26 NOTE — Progress Notes (Signed)
Recreation Therapy Notes  Date: 04.19.2017 Time: 9:30am Location: [address] Group room   Group Topic: Stress Management  Goal Area(s) Addresses:  Patient will actively participate in stress management techniques presented during session.   Behavioral Response: Did not attend.     Courtney Hansen, LRT/CTRS        Courtney Hansen L 12/26/2015 1:52 PM 

## 2015-12-26 NOTE — Progress Notes (Signed)
Patient ID: Courtney Hansen, female   DOB: September 08, 1976, 40 y.o.   MRN: [id number]  Pt. Denies SI/HI and A/V hallucinations. Belongings returned to patient at time of discharge. Patient denies any pain or discomfort. Discharge instructions and medications were reviewed with patient. Patient verbalized understanding of both medications and discharge instructions. She states, "I feel like I have a new perspective on life now." She is leaving in good spirits. Patient was discharged to lobby where her boyfriend was waiting to pick her up. No distress noted upon discharge. Q15 minute safety checks maintained until discharge.

## 2015-12-26 NOTE — Tx Team (Signed)
Interdisciplinary Treatment Plan Update (Adult) Date: 12/26/2015   Date: 12/26/2015 9:30am  Progress in Treatment:  Attending groups: Yes  Participating in groups: Yes  Taking medication as prescribed: Yes  Tolerating medication: Yes  Family/Significant other contact made: Yes, CSW spoke with patient's husband Patient understands diagnosis: Yes AEB seeking help for depression and mood instability  Discussing patient identified problems/goals with staff: Yes  Medical problems stabilized or resolved: Yes  Denies suicidal/homicidal ideation: Yes Patient has not harmed self or Others: Yes   New problem(s) identified: None identified at this time.   Discharge Plan or Barriers: Pt will return home and follow-up with outpatient resources  Additional comments:  Patient and CSW reviewed pt's identified goals and treatment plan. Patient verbalized understanding and agreed to treatment plan. CSW reviewed [facility] "Discharge Process and Patient Involvement" Form. Pt verbalized understanding of information provided and signed form.   Reason for Continuation of Hospitalization:  Depression Medication stabilization Suicidal ideation  Estimated length of stay: Discharge anticipated for today 12/26/15  Review of initial/current patient goals per problem list:   1.  Goal(s): Patient will participate in aftercare plan  Met:  Yes  Target date: 3-5 days from date of admission   As evidenced by: Patient will participate within aftercare plan AEB aftercare provider and housing plan at discharge being identified.   12/21/15: Pt will return home and follow-up with outpatient resources  2.  Goal (s): Patient will exhibit decreased depressive symptoms and suicidal ideations.  Met:  Adequate for discharge per MD  Target date: 3-5 days from date of admission   As evidenced by: Patient will utilize self rating of depression at 3 or below and demonstrate decreased signs of depression or be deemed stable for  discharge by MD. 12/21/15: Pt was admitted with symptoms of depression, rating 10/10. Pt continues to present with flat affect and depressive symptoms.  Pt will demonstrate decreased symptoms of depression and rate depression at 3/10 or lower prior to discharge. 4/19: Adequate for discharge per MD. Patient reports baseline symptoms of depression and denies SI.   Attendees:  Patient:    Family:    Physician: Dr. Parke Poisson, Dr. Sabra Heck, MD  12/26/2015 9:30 AM  Nursing:  Mayra Neer, Christa Michae Kava, RN 12/26/2015 9:30 AM  Clinical Social Worker: Maxie Better, LCSW 12/26/2015 9:30 AM  Other: Erasmo Downer Kenyah Luba, LCSW 12/26/2015 9:30 AM  Clinical:  Agustina Caroli, NP 12/26/2015 9:30 AM  Other:  12/26/2015 9:30 AM  Other:     Tilden Fossa, LCSW Clinical Social Worker [facility] [phone]

## 2015-12-26 NOTE — [facility] Suicide Risk Assessment (Signed)
[facility]   Principal Problem: MDD (major depressive disorder) [facility](HCC) Discharge Diagnoses:  Patient Active Problem List   Diagnosis Date Noted  . Severe episode of recurrent major depressive disorder, without psychotic features (HCC) [F33.2]   . MDD (major depressive disorder) (HCC) [F32.9] 12/20/2015    Total Time spent with patient: 30 minutes  Musculoskeletal: Strength & Muscle Tone: within normal limits Gait & Station: normal Patient leans: N/A  Psychiatric Specialty Exam: ROS denies headache, denies chest pain, denies shortness of breath , no vomiting   Blood pressure 103/67, pulse 61, temperature 98.1 F (36.7 C), temperature source Oral, resp. rate 16, height 4' 10.25" (1.48 m), weight 96 lb (43.545 kg), last menstrual period 12/06/2015.Body mass index is 19.88 kg/(m^2).  General Appearance: Well Groomed  Eye Contact::  Good  Speech:  Normal Rate409  Volume:  Normal  Mood:  improved, less improved , states " today is an OK day"  Affect:  Appropriate and more reactive, smiles at times appropriately   Thought Process:  Linear  Orientation:  Full (Time, Place, and Person)  Thought Content:  denies hallucinations, no delusions, not internally proeccupied   Suicidal Thoughts:  No denies any suicidal or self injurious ideations  Homicidal Thoughts:  No denies any homicidal ideations   Memory:  recent and remote grossly intact   Judgement:  Other:  improved   Insight:  improved   Psychomotor Activity:  Normal  Concentration:  Good  Recall:  Good  Fund of Knowledge:Good  Language: Good  Akathisia:  Negative  Handed:  Right  AIMS (if indicated):     Assets:  Communication Skills Desire for Improvement Resilience  Sleep:  Number of Hours: 6.5  Cognition: WNL  ADL's:  Intact   Mental Status Per Nursing Assessment::   On Admission:  Self-harm thoughts, Self-harm behaviors  Demographic Factors:  40 year old female, lives with boyfriend, has  two daughters, employed  Loss Factors: Father passed away last year   Historical Factors: History of depression, history of alcohol and cannabis abuse   Risk Reduction Factors:   Sense of responsibility to family, Employed, Living with another person, especially a relative, Positive social support and Positive coping skills or problem solving skills  Continued Clinical Symptoms:  At this time patient is alert, attentive,well related, presents with improved mood, improved range of affect, no thought disorder, no SI or HI, no psychotic symptoms, future oriented. Denies medication side effects at this time   Cognitive Features That Contribute To Risk:  No gross cognitive deficits noted upon discharge. Is alert , attentive, and oriented x 3   Suicide Risk:  Mild:  Suicidal ideation of limited frequency, intensity, duration, and specificity.  There are no identifiable plans, no associated intent, mild dysphoria and related symptoms, good self-control (both objective and subjective assessment), few other risk factors, and identifiable protective factors, including available and accessible social support.  Follow-up Information    Follow up with [facility].   Why:  Social worker will contact you within 1-2 business days of discharge with information about therapy and medication management appointments.    Contact information:   [address],  [place], [place] [address] [phone]      Plan Of Care/Follow-up recommendations:  Activity:  as tolerated Diet:  Regular Tests:  NA Other:  see below  Patient is leaving unit in good spirits  Plans to return home Plans to return to work next week Follow up as above  Encouraged to maintain sobriety, abstinence and consider 12 step program participation  Nehemiah Massed, MD 12/26/2015, 10:42 AM

## 2015-12-26 NOTE — Discharge Summary (Signed)
Physician Discharge Summary Note  Patient:  Courtney Hansen is an 40 y.o., female MRN:  [id number] DOB:  July 02, 1976 Patient phone:  [phone] (home)  Patient address:   [address] [place] [address],  Total Time spent with patient: Greater than 30 minutes  Date of Admission:  12/20/2015 Date of Discharge: 12-26-15  Reason for Admission: Worsening symptoms of depression  Principal Problem: MDD (major depressive disorder) [facility])  Discharge Diagnoses: Patient Active Problem List   Diagnosis Date Noted  . Severe episode of recurrent major depressive disorder, without psychotic features (HCC) [F33.2]   . MDD (major depressive disorder) (HCC) [F32.9] 12/20/2015   Past Psychiatric History: Major depressive disorder  Past Medical History:  Past Medical History  Diagnosis Date  . Depression    History reviewed. No pertinent past surgical history.  Family History:  Family History  Problem Relation Age of Onset  . Schizophrenia Brother   . Depression Brother    Family Psychiatric  History: See H&P  Social History:  History  Alcohol Use  . Yes    Comment: daily      History  Drug Use No    Social History   Social History  . Marital Status: Divorced    Spouse Name: N/A  . Number of Children: N/A  . Years of Education: N/A   Social History Main Topics  . Smoking status: Current Every Day Smoker -- 0.50 packs/day    Types: Cigarettes  . Smokeless tobacco: None  . Alcohol Use: Yes     Comment: daily   . Drug Use: No  . Sexual Activity: Yes    Birth Control/ Protection: None   Other Topics Concern  . None   Social History Narrative   Hospital Course:  40 year old female. States she has been feeling depressed " for a while, on & off". She feels depression worsened after her father passed away in 03/10/2015. She states she has been intermittently tearful, sad, and endorses neuro-vegetative symptoms Of depression as below . She has had increased  suicidal ideations, mostly passive " like saying why am I alive ", but has had some thoughts of overdosing . States, as above, that her depression has been persistent but intermittent. States " they have asked me if I am bipolar, but I don't think so, I just think that I feel normal some days ".   Courtney Hansen was admitted to the hospital for worsening symptoms of depression. She presented on admission with passive suicidal ideations, sadness & tearfulness. She was in need of mood stabilization treatments. After admission assessment, Courtney Hansen's presenting symptoms were identified. The medication regimen targeting those symptoms were initiated. She was medicated & discharged on; Citalopram 20 mg for depression, Hydroxyzine 25 mg for anxiety prn & Trazodone 100 mg for insomnia. Her other presenting medical problems were identified & treated. Courtney Hansen was enrolled & participated in the group counseling sessions being offered & held on this unit. She learned coping skills..  During the course of her hospitalization, Courtney Hansen's improvement was monitored by observation & her daily report of symptom reduction.  Her emotional and mental status were monitored by daily self-inventory reports completed by her & the clinical staff. She was evaluated by the treatment team for stability and plans for continued recovery after discharge. Courtney Hansen's motivation was an integral factor in her mood stability. She was offered further treatment options upon discharge to continue mental health care.   Upon discharge, Courtney Hansen was both mentally and  medically stable. She denies suicidal/homicidal ideations, auditory/visual/tactile hallucinations, delusional thoughts or paranoia. She received from the pharmacy, a 7 days worth, supply samples of her [facility] discharge medications. She left [facility] with all belongings in no distress. Transportation per husband.  Physical Findings: AIMS: Facial and Oral Movements Muscles of Facial Expression: None, normal Lips and  Perioral Area: None, normal Jaw: None, normal Tongue: None, normal,Extremity Movements Upper (arms, wrists, hands, fingers): None, normal Lower (legs, knees, ankles, toes): None, normal, Trunk Movements Neck, shoulders, hips: None, normal, Overall Severity Severity of abnormal movements (highest score from questions above): None, normal Incapacitation due to abnormal movements: None, normal Patient's awareness of abnormal movements (rate only patient's report): No Awareness, Dental Status Current problems with teeth and/or dentures?: No Does patient usually wear dentures?: No  CIWA:    COWS:     Musculoskeletal: Strength & Muscle Tone: within normal limits Gait & Station: normal Patient leans: N/A  Psychiatric Specialty Exam: Review of Systems  Constitutional: Negative.   HENT: Negative.   Eyes: Negative.   Respiratory: Negative.   Cardiovascular: Negative.   Gastrointestinal: Negative.   Genitourinary: Negative.   Musculoskeletal: Negative.   Skin: Negative.   Neurological: Negative.   Endo/Heme/Allergies: Negative.   Psychiatric/Behavioral: Positive for depression (Stable). Negative for suicidal ideas, hallucinations, memory loss and substance abuse. The patient has insomnia. The patient is not nervous/anxious.     Blood pressure 103/67, pulse 61, temperature 98.1 F (36.7 C), temperature source Oral, resp. rate 16, height 4' 10.25" (1.48 m), weight 43.545 kg (96 lb), last menstrual period 12/06/2015.Body mass index is 19.88 kg/(m^2).  See Md's SRA   Have you used any form of tobacco in the last 30 days? (Cigarettes, Smokeless Tobacco, Cigars, and/or Pipes): Yes  Has this patient used any form of tobacco in the last 30 days? (Cigarettes, Smokeless Tobacco, Cigars, and/or Pipes): No  Blood Alcohol level:  Lab Results  Component Value Date   ETH <5 12/20/2015   ETH 202* 12/17/2015   Metabolic Disorder Labs:  No results found for: HGBA1C, MPG No results found for:  PROLACTIN No results found for: CHOL, TRIG, HDL, CHOLHDL, VLDL, LDLCALC  See Psychiatric Specialty Exam and Suicide Risk Assessment completed by Attending Physician prior to discharge.  Discharge destination:  Home  Is patient on multiple antipsychotic therapies at discharge:  No   Has Patient had three or more failed trials of antipsychotic monotherapy by history:  No  Recommended Plan for Multiple Antipsychotic Therapies: NA    Medication List    TAKE these medications      Indication   citalopram 20 MG tablet  Commonly known as:  CELEXA  Take 1 tablet (20 mg total) by mouth daily. For depression   Indication:  Depression     hydrOXYzine 25 MG tablet  Commonly known as:  ATARAX/VISTARIL  Take 1 tablet (25 mg total) by mouth every 6 (six) hours as needed for itching or anxiety.   Indication:  Anxiety     metroNIDAZOLE 500 MG tablet  Commonly known as:  FLAGYL  Take 1 tablet (500 mg total) by mouth every 12 (twelve) hours. For yeast infections   Indication:  Vaginosis caused by Bacteria     traZODone 100 MG tablet  Commonly known as:  DESYREL  Take 1 tablet (100 mg total) by mouth at bedtime as needed for sleep.   Indication:  Trouble Sleeping       Follow-up Information    Follow up with [facility].   Why:  Therapy appointment with Lupita RaiderLatoya Waddell on Thursday April 27th at 10am. Medication management appointment with Mont Duttonrystal Montegue, NP on Thursday May 18th at [facility].    Contact information:   [address],  [place], [place] [address] [phone]      Please follow up.   Why:  Please arrive 15 minutes early for your appointments and bring your insurance card and hospital discharge paperwork with you. Call if you need to reschedule.      Follow-up recommendations: Activity:  As tolerated Diet: As recommended by your primary care doctor. Keep all scheduled follow-up appointments as recommended.  Comments: Take all your medications as  prescribed by your mental healthcare provider. Report any adverse effects and or reactions from your medicines to your outpatient provider promptly. Patient is instructed and cautioned to not engage in alcohol and or illegal drug use while on prescription medicines. In the event of worsening symptoms, patient is instructed to call the crisis hotline, 911 and or go to the nearest ED for appropriate evaluation and treatment of symptoms. Follow-up with your primary care provider for your other medical issues, concerns and or health care needs.     Signed: Sanjuana KavaNwoko, Agnes I, NP, PMHNP-BC 12/28/2015, 11:41 AM  I personally assessed the patient and formulated the plan Madie RenoIrving A. Dub MikesLugo, M.D.

## 2015-12-26 NOTE — Progress Notes (Signed)
  [facility] :  Will you be returning to the same living situation after discharge:  Yes,  patient plans to return home At discharge, do you have transportation home?: Yes,  patient's husband will provide transportation Do you have the ability to pay for your medications: Yes,  patient will be provided with prescriptions  Release of information consent forms completed and in the chart;  Patient's signature needed at discharge.  Patient to Follow up at: Follow-up Information    Follow up with [facility].   Why:  Social worker will contact you within 1-2 business days of discharge with information about therapy and medication management appointments.    Contact information:   [address],  [place], [place] [address] [phone]      Next level of care provider [facility] Link:no  Safety Planning and Suicide Prevention discussed: Yes,  with patient and husband  Have you used any form of tobacco in the last 30 days? (Cigarettes, Smokeless Tobacco, Cigars, and/or Pipes): Yes  Has patient been referred to the Quitline?: Patient refused referral  Patient has been referred for addiction treatment: Yes  Katrece Roediger, West CarboKristin L 12/26/2015, 10:09 AM

## 2016-11-30 ENCOUNTER — Emergency Department (HOSPITAL_COMMUNITY)
Admission: EM | Admit: 2016-11-30 | Discharge: 2016-11-30 | Disposition: A | Discharge status: Home / Self Care | Payer: Self-pay | Attending: Emergency Medicine | Admitting: Emergency Medicine

## 2016-11-30 ENCOUNTER — Emergency Department (HOSPITAL_COMMUNITY): Payer: Self-pay

## 2016-11-30 ENCOUNTER — Encounter (HOSPITAL_COMMUNITY): Payer: Self-pay

## 2016-11-30 DIAGNOSIS — Z79899 Other long term (current) drug therapy: Secondary | ICD-10-CM | POA: Insufficient documentation

## 2016-11-30 DIAGNOSIS — R141 Gas pain: Secondary | ICD-10-CM | POA: Insufficient documentation

## 2016-11-30 DIAGNOSIS — F17210 Nicotine dependence, cigarettes, uncomplicated: Secondary | ICD-10-CM | POA: Insufficient documentation

## 2016-11-30 DIAGNOSIS — K2920 Alcoholic gastritis without bleeding: Secondary | ICD-10-CM | POA: Insufficient documentation

## 2016-11-30 DIAGNOSIS — F1410 Cocaine abuse, uncomplicated: Secondary | ICD-10-CM | POA: Insufficient documentation

## 2016-11-30 DIAGNOSIS — F1210 Cannabis abuse, uncomplicated: Secondary | ICD-10-CM | POA: Insufficient documentation

## 2016-11-30 DIAGNOSIS — F1910 Other psychoactive substance abuse, uncomplicated: Secondary | ICD-10-CM

## 2016-11-30 LAB — I-STAT CHEM 8, ED
BUN: 8 mg/dL (ref 6–20)
Calcium, Ion: 1.3 mmol/L (ref 1.15–1.40)
Chloride: 101 mmol/L (ref 101–111)
Creatinine, Ser: 0.8 mg/dL (ref 0.44–1.00)
Glucose, Bld: 74 mg/dL (ref 65–99)
HEMATOCRIT: 42 % (ref 36.0–46.0)
HEMOGLOBIN: 14.3 g/dL (ref 12.0–15.0)
POTASSIUM: 4.6 mmol/L (ref 3.5–5.1)
Sodium: 140 mmol/L (ref 135–145)
TCO2: 24 mmol/L (ref 0–100)

## 2016-11-30 LAB — CBC WITH DIFFERENTIAL/PLATELET
BASOS ABS: 0 10*3/uL (ref 0.0–0.1)
BASOS PCT: 0 %
EOS PCT: 0 %
Eosinophils Absolute: 0 10*3/uL (ref 0.0–0.7)
HCT: 40 % (ref 36.0–46.0)
Hemoglobin: 13.5 g/dL (ref 12.0–15.0)
Lymphocytes Relative: 16 %
Lymphs Abs: 2.2 10*3/uL (ref 0.7–4.0)
MCH: 32.8 pg (ref 26.0–34.0)
MCHC: 33.8 g/dL (ref 30.0–36.0)
MCV: 97.1 fL (ref 78.0–100.0)
MONO ABS: 1.2 10*3/uL — AB (ref 0.1–1.0)
Monocytes Relative: 9 %
Neutro Abs: 10.3 10*3/uL — ABNORMAL HIGH (ref 1.7–7.7)
Neutrophils Relative %: 75 %
PLATELETS: 205 10*3/uL (ref 150–400)
RBC: 4.12 MIL/uL (ref 3.87–5.11)
RDW: 13.7 % (ref 11.5–15.5)
WBC: 13.7 10*3/uL — AB (ref 4.0–10.5)

## 2016-11-30 LAB — RAPID URINE DRUG SCREEN, HOSP PERFORMED
Amphetamines: NOT DETECTED
Barbiturates: NOT DETECTED
Benzodiazepines: NOT DETECTED
Cocaine: POSITIVE — AB
OPIATES: NOT DETECTED
Tetrahydrocannabinol: POSITIVE — AB

## 2016-11-30 LAB — I-STAT TROPONIN, ED
TROPONIN I, POC: 0 ng/mL (ref 0.00–0.08)
TROPONIN I, POC: 0.01 ng/mL (ref 0.00–0.08)

## 2016-11-30 LAB — POC URINE PREG, ED: PREG TEST UR: NEGATIVE

## 2016-11-30 MED ORDER — GI COCKTAIL ~~LOC~~
30.0000 mL | Freq: Once | ORAL | Status: AC
  Administered 2016-11-30: 30 mL via ORAL
  Filled 2016-11-30: qty 30

## 2016-11-30 MED ORDER — HALOPERIDOL LACTATE 5 MG/ML IJ SOLN
2.0000 mg | Freq: Once | INTRAMUSCULAR | Status: AC
  Administered 2016-11-30: 2 mg via INTRAMUSCULAR
  Filled 2016-11-30: qty 1

## 2016-11-30 MED ORDER — SUCRALFATE 1 GM/10ML PO SUSP: 1.0000 g | Freq: Three times a day (TID) | ORAL | 0 refills | Status: DC

## 2016-11-30 NOTE — ED Triage Notes (Signed)
Pt complaining of central chest pain, states radiates to R shoulder x 1 day.

## 2016-11-30 NOTE — ED Notes (Signed)
Med given 

## 2016-11-30 NOTE — ED Notes (Signed)
The pt reports that her pain is better 

## 2016-11-30 NOTE — ED Notes (Signed)
sleeping

## 2016-11-30 NOTE — ED Provider Notes (Signed)
[facility]-EMERGENCY DEPT Provider Note   CSN: [id number] Arrival date & time: 11/30/16  [id number]  By signing my name below, I, Rosario AdieWilliam Andrew Hiatt, attest that this documentation has been prepared under the direction and in the presence of Menucha Dicesare, MD. Electronically Signed: Rosario AdieWilliam Andrew Hiatt, ED Scribe. 11/30/16. 12:43 AM.  History   Chief Complaint Chief Complaint  Patient presents with  . Chest Pain   The history is provided by the patient. No language interpreter was used.  Chest Pain   This is a new problem. The current episode started 2 days ago. The problem occurs hourly. The pain is associated with rest. The pain is present in the substernal region. The pain is at a severity of 10/10. The pain is moderate. Quality: pulsating. The pain radiates to the right shoulder. Duration of episode(s) is 2 days. The symptoms are aggravated by certain positions. Pertinent negatives include no cough, no fever, no palpitations and no shortness of breath. Treatments tried: GasX. The treatment provided no relief. Risk factors include substance abuse.  Pertinent negatives for past medical history include no Marfan's syndrome and no MI.  Pertinent negatives for family medical history include: no early MI.  Procedure history is negative for stress echo.   HPI Comments: Courtney Hansen is a 41 y.o. female with a h/o MDD, who presents to the Emergency Department complaining of intermittent episodes centralized chest pain beginning two days ago. She describes her pain as pulsating and she notes radiation of pain into her right shoulder. Per pt, she was at rest two days ago when her pain began. Her pain is worse with laying supine. She has been taking anti-flatus medication at home without relief of her pain. Pt has been able to pass-flatus since the onset of her pian. No Hx of PE/DVT, recent long travel, surgery, fracture, prolonged immobilization, hormone use. She denies shortness of breath, fever, or any  other associated symptoms.     Past Medical History:  Diagnosis Date  . Depression    Patient Active Problem List   Diagnosis Date Noted  . Severe episode of recurrent major depressive disorder, without psychotic features (HCC)   . MDD (major depressive disorder) 12/20/2015   History reviewed. No pertinent surgical history.  OB History    No data available     Home Medications    Prior to Admission medications   Medication Sig Start Date End Date Taking? Authorizing Provider  citalopram (CELEXA) 20 MG tablet Take 1 tablet (20 mg total) by mouth daily. For depression 12/26/15   Sanjuana KavaAgnes I Nwoko, NP  hydrOXYzine (ATARAX/VISTARIL) 25 MG tablet Take 1 tablet (25 mg total) by mouth every 6 (six) hours as needed for itching or anxiety. 12/26/15   Sanjuana KavaAgnes I Nwoko, NP  metroNIDAZOLE (FLAGYL) 500 MG tablet Take 1 tablet (500 mg total) by mouth every 12 (twelve) hours. For yeast infections 12/26/15   Sanjuana KavaAgnes I Nwoko, NP  traZODone (DESYREL) 100 MG tablet Take 1 tablet (100 mg total) by mouth at bedtime as needed for sleep. 12/26/15   Sanjuana KavaAgnes I Nwoko, NP   Family History Family History  Problem Relation Age of Onset  . Schizophrenia Brother   . Depression Brother    Social History Social History  Substance Use Topics  . Smoking status: Current Every Day Smoker    Packs/day: 0.50    Types: Cigarettes  . Smokeless tobacco: Never Used  . Alcohol use Yes     Comment: daily    Allergies  Patient has no known allergies.  Review of Systems Review of Systems  Constitutional: Negative for appetite change, chills and fever.  HENT: Negative for drooling and facial swelling.   Eyes: Negative for photophobia.  Respiratory: Negative for cough and shortness of breath.   Cardiovascular: Positive for chest pain. Negative for palpitations and leg swelling.  Gastrointestinal: Negative for anal bleeding.  Genitourinary: Negative for difficulty urinating.  Neurological: Negative for facial asymmetry and  speech difficulty.  Psychiatric/Behavioral: Negative for suicidal ideas.  All other systems reviewed and are negative.  Physical Exam Updated Vital Signs LMP 11/09/2016 (Approximate)   Physical Exam  Constitutional: She is oriented to person, place, and time. She appears well-developed and well-nourished.  HENT:  Head: Normocephalic.  Mouth/Throat: Oropharynx is clear and moist. No oropharyngeal exudate.  Eyes: Conjunctivae and EOM are normal. Pupils are equal, round, and reactive to light. Right eye exhibits no discharge. Left eye exhibits no discharge. No scleral icterus.  Neck: Normal range of motion. Neck supple. No JVD present. No tracheal deviation present.  Trachea is midline. No stridor or carotid bruits. No JVD.   Cardiovascular: Normal rate, regular rhythm, normal heart sounds and intact distal pulses.   No murmur heard. Pulmonary/Chest: Effort normal and breath sounds normal. No stridor. No respiratory distress. She has no wheezes. She has no rales.  Lungs CTA bilaterally.  Abdominal: Soft. She exhibits no distension. Bowel sounds are increased. There is no tenderness. There is no rebound and no guarding.  Musculoskeletal: Normal range of motion. She exhibits no edema or tenderness.  All compartments are soft. No palpable cords.   Lymphadenopathy:    She has no cervical adenopathy.  Neurological: She is alert and oriented to person, place, and time. She has normal reflexes. She displays normal reflexes. She exhibits normal muscle tone.  Skin: Skin is warm and dry. Capillary refill takes less than 2 seconds.  Psychiatric: She is aggressive. She expresses no suicidal plans and no homicidal plans.  Nursing note and vitals reviewed.  ED Treatments / Results   Vitals:   11/30/16 0130 11/30/16 0200  BP: 116/85 121/76  Pulse: 78 66  Resp:    Temp:      IAGNOSTIC STUDIES: Oxygen Saturation is 98% on RA, normal by my interpretation.   COORDINATION OF CARE: 12:38  AM-Discussed next steps with pt. Pt verbalized understanding and is agreeable with the plan.   Results for orders placed or performed during the hospital encounter of 12/20/15  Pregnancy, urine  Result Value Ref Range   Preg Test, Ur NEGATIVE NEGATIVE  Urine rapid drug screen (hosp performed)  Result Value Ref Range   Opiates NONE DETECTED NONE DETECTED   Cocaine POSITIVE (A) NONE DETECTED   Benzodiazepines NONE DETECTED NONE DETECTED   Amphetamines NONE DETECTED NONE DETECTED   Tetrahydrocannabinol POSITIVE (A) NONE DETECTED   Barbiturates NONE DETECTED NONE DETECTED  Ethanol  Result Value Ref Range   Alcohol, Ethyl (B) <5 <5 mg/dL  Comprehensive metabolic panel  Result Value Ref Range   Sodium 135 135 - 145 mmol/L   Potassium 3.4 (L) 3.5 - 5.1 mmol/L   Chloride 101 101 - 111 mmol/L   CO2 29 22 - 32 mmol/L   Glucose, Bld 117 (H) 65 - 99 mg/dL   BUN 9 6 - 20 mg/dL   Creatinine, Ser 4.69 0.44 - 1.00 mg/dL   Calcium 8.8 (L) 8.9 - 10.3 mg/dL   Total Protein 6.5 6.5 - 8.1 g/dL   Albumin 4.2  3.5 - 5.0 g/dL   AST 24 15 - 41 U/L   ALT 16 14 - 54 U/L   Alkaline Phosphatase 76 38 - 126 U/L   Total Bilirubin 2.5 (H) 0.3 - 1.2 mg/dL   GFR calc non Af Amer >60 >60 mL/min   GFR calc Af Amer >60 >60 mL/min   Anion gap 5 5 - 15  CBC with Differential  Result Value Ref Range   WBC 6.6 4.0 - 10.5 K/uL   RBC 3.86 (L) 3.87 - 5.11 MIL/uL   Hemoglobin 13.0 12.0 - 15.0 g/dL   HCT 10.2 72.5 - 36.6 %   MCV 97.2 78.0 - 100.0 fL   MCH 33.7 26.0 - 34.0 pg   MCHC 34.7 30.0 - 36.0 g/dL   RDW 44.0 34.7 - 42.5 %   Platelets 189 150 - 400 K/uL   Neutrophils Relative % 57 %   Neutro Abs 3.7 1.7 - 7.7 K/uL   Lymphocytes Relative 33 %   Lymphs Abs 2.2 0.7 - 4.0 K/uL   Monocytes Relative 9 %   Monocytes Absolute 0.6 0.1 - 1.0 K/uL   Eosinophils Relative 1 %   Eosinophils Absolute 0.1 0.0 - 0.7 K/uL   Basophils Relative 0 %   Basophils Absolute 0.0 0.0 - 0.1 K/uL   No results found.   EKG  Interpretation  Date/Time:  Sunday November 30 2016 00:31:38 EDT Ventricular Rate:  78 PR Interval:  124 QRS Duration: 50 QT Interval:  404 QTC Calculation: 460 R Axis:   76 Text Interpretation:  Normal sinus rhythm Confirmed by [facility]  MD, Davy Faught ([id number]) on 11/30/2016 12:41:55 AM      Procedures Procedures   Medications Ordered in ED  Medications  gi cocktail (Maalox,Lidocaine,Donnatal) (30 mLs Oral Given 11/30/16 0055)  haloperidol lactate (HALDOL) injection 2 mg (2 mg Intramuscular Given 11/30/16 0058)     Initial Impression / Assessment and Plan / ED Course  I have reviewed the triage vital signs and the nursing notes.  Pertinent labs & imaging results that were available during my care of the patient were reviewed by me and considered in my medical decision making (see chart for details).     This is a 41 y.o. -year-old female presents with alcoholic gastritis and gas pain as well as polysubstance abuse/  Ruled out for MI in the ED, HEART score is 0.  Patient is PERC negative and wells 0 and I highly doubt PE in this low risk patient.  Pain was epigastric and patient is better post Gi cocktail.  She was also very grateful for the haldol which made her feel much better. The patient is nontoxic-appearing on exam and vital signs are within normal limits.    After history, exam, and medical workup I feel the patient has been appropriately medically screened and is safe for discharge home. Pertinent diagnoses were discussed with the patient. Patient was given return precautions for chest pain, arm pain neck pain diaphoresis, lethargy fevers, intractable abdominal pain rectal bleeding vomiting coffee grounds or any concerns.  Stop all illicit drugs and all alcohol.    I personally performed the services described in this documentation, which was scribed in my presence. The recorded information has been reviewed and is accurate.       Cy Blamer, MD 11/30/16 [phone]

## 2016-11-30 NOTE — ED Notes (Signed)
The pt is c/o epigastric pain for 48 hours   She has tasken antiacids that have not helped and she has not been eating    She still has her gallbladder, groaning loudly.  She reports that she has brusing on her mid-chest from  Holding onto her chest

## 2016-11-30 NOTE — ED Notes (Addendum)
Pt complaining of lump in chest. Pt states lump is moving. No obvious mass noted in triage.

## 2016-11-30 NOTE — ED Notes (Signed)
Pt to and from x-ray

## 2017-04-28 ENCOUNTER — Encounter (HOSPITAL_COMMUNITY): Payer: Self-pay | Admitting: Emergency Medicine

## 2017-04-28 ENCOUNTER — Emergency Department (HOSPITAL_COMMUNITY): Payer: Self-pay

## 2017-04-28 ENCOUNTER — Inpatient Hospital Stay (HOSPITAL_COMMUNITY)
Admission: EM | Admit: 2017-04-28 | Discharge: 2017-04-30 | DRG: 918 | Discharge status: Against Medical Advice | Payer: Self-pay | Attending: Internal Medicine | Admitting: Internal Medicine

## 2017-04-28 DIAGNOSIS — Z630 Problems in relationship with spouse or partner: Secondary | ICD-10-CM

## 2017-04-28 DIAGNOSIS — F1210 Cannabis abuse, uncomplicated: Secondary | ICD-10-CM | POA: Diagnosis present

## 2017-04-28 DIAGNOSIS — F1010 Alcohol abuse, uncomplicated: Secondary | ICD-10-CM

## 2017-04-28 DIAGNOSIS — Z818 Family history of other mental and behavioral disorders: Secondary | ICD-10-CM

## 2017-04-28 DIAGNOSIS — Z7289 Other problems related to lifestyle: Secondary | ICD-10-CM | POA: Diagnosis present

## 2017-04-28 DIAGNOSIS — Z789 Other specified health status: Secondary | ICD-10-CM | POA: Diagnosis present

## 2017-04-28 DIAGNOSIS — F10920 Alcohol use, unspecified with intoxication, uncomplicated: Secondary | ICD-10-CM

## 2017-04-28 DIAGNOSIS — F419 Anxiety disorder, unspecified: Secondary | ICD-10-CM | POA: Diagnosis present

## 2017-04-28 DIAGNOSIS — Z79899 Other long term (current) drug therapy: Secondary | ICD-10-CM

## 2017-04-28 DIAGNOSIS — X58XXXA Exposure to other specified factors, initial encounter: Secondary | ICD-10-CM | POA: Diagnosis present

## 2017-04-28 DIAGNOSIS — T465X2A Poisoning by other antihypertensive drugs, intentional self-harm, initial encounter: Principal | ICD-10-CM | POA: Diagnosis present

## 2017-04-28 DIAGNOSIS — Y906 Blood alcohol level of 120-199 mg/100 ml: Secondary | ICD-10-CM | POA: Diagnosis present

## 2017-04-28 DIAGNOSIS — T1491XA Suicide attempt, initial encounter: Secondary | ICD-10-CM

## 2017-04-28 DIAGNOSIS — F10120 Alcohol abuse with intoxication, uncomplicated: Secondary | ICD-10-CM | POA: Diagnosis present

## 2017-04-28 DIAGNOSIS — F332 Major depressive disorder, recurrent severe without psychotic features: Secondary | ICD-10-CM | POA: Diagnosis present

## 2017-04-28 DIAGNOSIS — T465X1A Poisoning by other antihypertensive drugs, accidental (unintentional), initial encounter: Secondary | ICD-10-CM | POA: Diagnosis present

## 2017-04-28 DIAGNOSIS — F1410 Cocaine abuse, uncomplicated: Secondary | ICD-10-CM | POA: Diagnosis present

## 2017-04-28 DIAGNOSIS — F17210 Nicotine dependence, cigarettes, uncomplicated: Secondary | ICD-10-CM | POA: Diagnosis present

## 2017-04-28 DIAGNOSIS — T50902A Poisoning by unspecified drugs, medicaments and biological substances, intentional self-harm, initial encounter: Secondary | ICD-10-CM

## 2017-04-28 LAB — ACETAMINOPHEN LEVEL
Acetaminophen (Tylenol), Serum: <10 ug/mL — ABNORMAL LOW (ref 10–30)
Acetaminophen (Tylenol), Serum: <10 ug/mL — ABNORMAL LOW (ref 10–30)

## 2017-04-28 LAB — COMPREHENSIVE METABOLIC PANEL
ALBUMIN: 4.1 g/dL (ref 3.5–5.0)
ALK PHOS: 47 U/L (ref 38–126)
ALT: 12 U/L — ABNORMAL LOW (ref 14–54)
AST: 22 U/L (ref 15–41)
Anion gap: 9 (ref 5–15)
BUN: 7 mg/dL (ref 6–20)
CHLORIDE: 110 mmol/L (ref 101–111)
CO2: 22 mmol/L (ref 22–32)
CREATININE: 0.53 mg/dL (ref 0.44–1.00)
Calcium: 8.7 mg/dL — ABNORMAL LOW (ref 8.9–10.3)
GFR calc non Af Amer: >60 mL/min (ref >60)
Glucose, Bld: 134 mg/dL — ABNORMAL HIGH (ref 65–99)
Potassium: 3.7 mmol/L (ref 3.5–5.1)
SODIUM: 141 mmol/L (ref 135–145)
Total Bilirubin: 0.6 mg/dL (ref 0.3–1.2)
Total Protein: 6.3 g/dL — ABNORMAL LOW (ref 6.5–8.1)

## 2017-04-28 LAB — BASIC METABOLIC PANEL
Anion gap: 7 (ref 5–15)
BUN: 6 mg/dL (ref 6–20)
CHLORIDE: 114 mmol/L — AB (ref 101–111)
CO2: 21 mmol/L — ABNORMAL LOW (ref 22–32)
Calcium: 8.1 mg/dL — ABNORMAL LOW (ref 8.9–10.3)
Creatinine, Ser: 0.41 mg/dL — ABNORMAL LOW (ref 0.44–1.00)
Glucose, Bld: 106 mg/dL — ABNORMAL HIGH (ref 65–99)
Potassium: 3.6 mmol/L (ref 3.5–5.1)
SODIUM: 142 mmol/L (ref 135–145)

## 2017-04-28 LAB — CBC
HEMATOCRIT: 34.1 % — AB (ref 36.0–46.0)
HEMATOCRIT: 31.6 % — AB (ref 36.0–46.0)
HEMOGLOBIN: 10.9 g/dL — AB (ref 12.0–15.0)
Hemoglobin: 11.8 g/dL — ABNORMAL LOW (ref 12.0–15.0)
MCH: 33.1 pg (ref 26.0–34.0)
MCH: 33.6 pg (ref 26.0–34.0)
MCHC: 34.6 g/dL (ref 30.0–36.0)
MCHC: 34.5 g/dL (ref 30.0–36.0)
MCV: 95.8 fL (ref 78.0–100.0)
MCV: 97.5 fL (ref 78.0–100.0)
Platelets: 222 10*3/uL (ref 150–400)
Platelets: 192 10*3/uL (ref 150–400)
RBC: 3.56 MIL/uL — AB (ref 3.87–5.11)
RBC: 3.24 MIL/uL — AB (ref 3.87–5.11)
RDW: 14 % (ref 11.5–15.5)
RDW: 14.4 % (ref 11.5–15.5)
WBC: 7.3 10*3/uL (ref 4.0–10.5)
WBC: 6.4 10*3/uL (ref 4.0–10.5)

## 2017-04-28 LAB — RAPID URINE DRUG SCREEN, HOSP PERFORMED
AMPHETAMINES: NOT DETECTED
BARBITURATES: NOT DETECTED
BENZODIAZEPINES: NOT DETECTED
Cocaine: POSITIVE — AB
Opiates: NOT DETECTED
TETRAHYDROCANNABINOL: POSITIVE — AB

## 2017-04-28 LAB — SALICYLATE LEVEL: Salicylate Lvl: <7 mg/dL (ref 2.8–30.0)

## 2017-04-28 LAB — HCG, QUANTITATIVE, PREGNANCY: hCG, Beta Chain, Quant, S: 1 m[IU]/mL (ref <5)

## 2017-04-28 LAB — CBG MONITORING, ED
GLUCOSE-CAPILLARY: 121 mg/dL — AB (ref 65–99)
GLUCOSE-CAPILLARY: 107 mg/dL — AB (ref 65–99)

## 2017-04-28 LAB — ETHANOL: Alcohol, Ethyl (B): 163 mg/dL — ABNORMAL HIGH (ref <5)

## 2017-04-28 LAB — MRSA PCR SCREENING: MRSA by PCR: NEGATIVE

## 2017-04-28 MED ORDER — SODIUM CHLORIDE 0.9 % IV BOLUS (SEPSIS)
1000.0000 mL | Freq: Once | INTRAVENOUS | Status: AC
  Administered 2017-04-28: 1000 mL via INTRAVENOUS

## 2017-04-28 MED ORDER — ACETAMINOPHEN 650 MG RE SUPP: 650.0000 mg | Freq: Four times a day (QID) | RECTAL | Status: DC | PRN

## 2017-04-28 MED ORDER — SODIUM CHLORIDE 0.9 % IV SOLN
INTRAVENOUS | Status: DC
  Administered 2017-04-28 – 2017-04-29 (×4): via INTRAVENOUS

## 2017-04-28 MED ORDER — ACETAMINOPHEN 325 MG PO TABS
650.0000 mg | ORAL_TABLET | Freq: Four times a day (QID) | ORAL | Status: DC | PRN
Start: 2017-04-28 — End: 2017-04-30
  Administered 2017-04-29: 650 mg via ORAL
  Filled 2017-04-28: qty 2

## 2017-04-28 MED ORDER — ONDANSETRON HCL 4 MG PO TABS: 4.0000 mg | ORAL_TABLET | Freq: Four times a day (QID) | ORAL | Status: DC | PRN

## 2017-04-28 MED ORDER — ZOLPIDEM TARTRATE 5 MG PO TABS: 5.0000 mg | ORAL_TABLET | Freq: Every evening | ORAL | Status: DC | PRN

## 2017-04-28 MED ORDER — NICOTINE 21 MG/24HR TD PT24
21.0000 mg | MEDICATED_PATCH | Freq: Every day | TRANSDERMAL | Status: DC
  Filled 2017-04-28: qty 1

## 2017-04-28 MED ORDER — ONDANSETRON HCL 4 MG/2ML IJ SOLN
4.0000 mg | Freq: Four times a day (QID) | INTRAMUSCULAR | Status: DC | PRN
  Administered 2017-04-29: 4 mg via INTRAVENOUS
  Filled 2017-04-28: qty 2

## 2017-04-28 MED ORDER — ENOXAPARIN SODIUM 30 MG/0.3ML ~~LOC~~ SOLN
30.0000 mg | SUBCUTANEOUS | Status: DC
  Administered 2017-04-28 – 2017-04-29 (×2): 30 mg via SUBCUTANEOUS
  Filled 2017-04-28 (×2): qty 0.3

## 2017-04-28 NOTE — ED Notes (Signed)
Dr.Niu at bedside to discuss with patient need to have IV access. Pt has also removed EKG leads and is not complying with staff on treatment needed. Pt stated that she did not want to stay and Dr.Niu notified.

## 2017-04-28 NOTE — ED Notes (Signed)
In rounding on pt was found to have IV access removed and fluids in floor. No active bleeding at site and fluids that were in floor were cleaned to prevent slipping.

## 2017-04-28 NOTE — ED Notes (Signed)
Bed: WA20 Expected date:  Expected time:  Means of arrival:  Comments: 

## 2017-04-28 NOTE — ED Provider Notes (Signed)
[facility]-EMERGENCY DEPT Provider Note   CSN: [id number] Arrival date & time: 04/28/17  4098     History   Chief Complaint Chief Complaint  Patient presents with  . Drug Overdose  . Alcohol Intoxication    HPI Courtney Hansen is a 41 y.o. female.  HPI  This is a 41 year old female with a history of depression who presents by EMS with an overdose. Per EMS, patient took 15-20 tablets of 0.1 mg clonidine at 0130. Also reported alcohol ingestion.  She is somnolent but arousable.  Denies intentional ingestion but does endorse taking the pills. Also reports alcohol use. Currently complaining of shortness of breath.  Denies chest pain or recent illness.  Level V caveat for acuity of condition  Past Medical History:  Diagnosis Date  . Depression     Patient Active Problem List   Diagnosis Date Noted  . Severe episode of recurrent major depressive disorder, without psychotic features (HCC)   . MDD (major depressive disorder) 12/20/2015    History reviewed. No pertinent surgical history.  OB History    No data available       Home Medications    Prior to Admission medications   Medication Sig Start Date End Date Taking? Authorizing Provider  cloNIDine (CATAPRES) 0.1 MG tablet Take 0.1 mg by mouth at bedtime as needed (for sleep).   Yes [provider]  citalopram (CELEXA) 20 MG tablet Take 1 tablet (20 mg total) by mouth daily. For depression Patient not taking: Reported on 11/30/2016 12/26/15   Armandina Stammer I, NP  hydrOXYzine (ATARAX/VISTARIL) 25 MG tablet Take 1 tablet (25 mg total) by mouth every 6 (six) hours as needed for itching or anxiety. Patient not taking: Reported on 11/30/2016 12/26/15   Armandina Stammer I, NP  metroNIDAZOLE (FLAGYL) 500 MG tablet Take 1 tablet (500 mg total) by mouth every 12 (twelve) hours. For yeast infections Patient not taking: Reported on 11/30/2016 12/26/15   Armandina Stammer I, NP  sucralfate (CARAFATE) 1 GM/10ML suspension Take 10 mLs (1 g total)  by mouth 4 (four) times daily -  with meals and at bedtime. Patient not taking: Reported on 04/28/2017 11/30/16   Palumbo, April, MD  traZODone (DESYREL) 100 MG tablet Take 1 tablet (100 mg total) by mouth at bedtime as needed for sleep. Patient not taking: Reported on 11/30/2016 12/26/15   Sanjuana Kava, NP    Family History Family History  Problem Relation Age of Onset  . Schizophrenia Brother   . Depression Brother     Social History Social History  Substance Use Topics  . Smoking status: Current Every Day Smoker    Packs/day: 0.50    Types: Cigarettes  . Smokeless tobacco: Never Used  . Alcohol use Yes     Comment: daily      Allergies   Patient has no known allergies.   Review of Systems Review of Systems  Respiratory: Positive for shortness of breath.   Cardiovascular: Negative for chest pain.  Gastrointestinal: Negative for abdominal pain.  Neurological: Positive for headaches.  All other systems reviewed and are negative.    Physical Exam Updated Vital Signs BP (!) 81/60   Pulse (!) 58   Temp 97.7 F (36.5 C) (Oral)   Resp (!) 21   Ht 4\' 10"  (1.473 m)   Wt 40.8 kg (90 lb)   SpO2 94%   BMI 18.81 kg/m   Physical Exam  Constitutional:  Somnolent but arousable, airway intact  HENT:  Head: Normocephalic and atraumatic.  Eyes: Pupils are equal, round, and reactive to light.  Pupils 2 mm and minimally reactive bilaterally  Cardiovascular: Normal rate, regular rhythm and normal heart sounds.   Pulmonary/Chest: Effort normal and breath sounds normal. No respiratory distress. She has no wheezes.  Abdominal: Soft. Bowel sounds are normal. There is no tenderness.  Neurological:  Somnolent but arousable and oriented 4  Skin: Skin is warm and dry.  Psychiatric: She has a normal mood and affect.  Nursing note and vitals reviewed.    ED Treatments / Results  Labs (all labs ordered are listed, but only abnormal results are displayed) Labs Reviewed    COMPREHENSIVE METABOLIC PANEL - Abnormal; Notable for the following:       Result Value   Glucose, Bld 134 (*)    Calcium 8.7 (*)    Total Protein 6.3 (*)    ALT 12 (*)    All other components within normal limits  ETHANOL - Abnormal; Notable for the following:    Alcohol, Ethyl (B) 163 (*)    All other components within normal limits  ACETAMINOPHEN LEVEL - Abnormal; Notable for the following:    Acetaminophen (Tylenol), Serum <10 (*)    All other components within normal limits  CBC - Abnormal; Notable for the following:    RBC 3.56 (*)    Hemoglobin 11.8 (*)    HCT 34.1 (*)    All other components within normal limits  CBG MONITORING, ED - Abnormal; Notable for the following:    Glucose-Capillary 121 (*)    All other components within normal limits  SALICYLATE LEVEL  RAPID URINE DRUG SCREEN, HOSP PERFORMED    EKG  EKG Interpretation  Date/Time:  Tuesday April 28 2017 02:52:41 EDT Ventricular Rate:  54 PR Interval:    QRS Duration: 76 QT Interval:  520 QTC Calculation: 493 R Axis:   84 Text Interpretation:  Sinus rhythm Anteroseptal infarct, age indeterminate Lateral leads are also involved Confirmed by Ross Marcus ([id number]) on 04/28/2017 3:50:04 AM       Radiology No results found.  Procedures Procedures (including critical care time)  CRITICAL CARE Performed by: Shon Baton   Total critical care time: 25 minutes  Critical care time was exclusive of separately billable procedures and treating other patients.  Critical care was necessary to treat or prevent imminent or life-threatening deterioration.  Critical care was time spent personally by me on the following activities: development of treatment plan with patient and/or surrogate as well as nursing, discussions with consultants, evaluation of patient's response to treatment, examination of patient, obtaining history from patient or surrogate, ordering and performing treatments and  interventions, ordering and review of laboratory studies, ordering and review of radiographic studies, pulse oximetry and re-evaluation of patient's condition.   Medications Ordered in ED Medications  sodium chloride 0.9 % bolus 1,000 mL (1,000 mLs Intravenous New Bag/Given 04/28/17 0407)     Initial Impression / Assessment and Plan / ED Course  I have reviewed the triage vital signs and the nursing notes.  Pertinent labs & imaging results that were available during my care of the patient were reviewed by me and considered in my medical decision making (see chart for details).     Patient presents after ingesting clonidine and alcohol. She somnolent but arousable. Currently ABCs intact. Blood pressure is 90 systolic. Patient given fluids. Poison control recommendations include supportive measure and 8 hours of observation.  Blood alcohol level is 163. Otherwise  lab workup is largely reassuring.  Patient checked on multiple times. More arousable.  4:30 AM  Blood pressures have been soft. Fluid is infusing. Patient is more awake at this time.  Given prolonged observation and soft blood pressures, will admit for supportive measures.  Final Clinical Impressions(s) / ED Diagnoses   Final diagnoses:  Intentional drug overdose, initial encounter [facility])  Alcoholic intoxication without complication [facility])    New Prescriptions New Prescriptions   No medications on file     Shon Baton, MD 04/28/17 0430

## 2017-04-28 NOTE — ED Notes (Addendum)
Patient refuses to allow IV access as ordered. Blood pressure 88/61, patient informed of BP and need for IV access multiple times and she still refuses to allow IV access.

## 2017-04-28 NOTE — ED Notes (Signed)
Pt asked if she is willing to receive IV, and IV fluids as ordered, patient still refusing to allow access.

## 2017-04-28 NOTE — ED Notes (Signed)
PT refused to allow staff to restart IV access and draw AM labs until blood pressure is below 90/60. Dr.Niu paged

## 2017-04-28 NOTE — H&P (Addendum)
History and Physical    [name]:[id number] DOB: 09-24-1975 DOA: 04/28/2017  Referring MD/NP/PA:   PCP: [name], NP   Patient coming from:  The patient is coming from home.  At baseline, pt is independent for most of ADL.   Chief Complaint: Clonidine overdose  HPI: Courtney Hansen is a 41 y.o. female with medical history significant of depression, tobacco abuse, alcohol use, who presents with clonidine overdose.  Per EMS, patient took 15-20 tablets of 0.1 mg clonidine at 0130. Also reported alcohol ingestion. Per EDP, patient was initially somnolent but arousable. When I saw pt in ED, she is fully alert, oriented x 3. She reported to EDP that she had mild SOB, but denies SOB to me. She also denies chest pain, cough, nausea, vomiting, diarrhea, abdominal pain, symptoms of UTI or unilateral weakness. She is very emotional, and crying. She complains of of feeling cold in left arm where the IV fluid is going on. She states that she drinks alcohol occasionally, denies drinking alcohol every day. She denies suicidal or homicidal ideations to me. Patient was hypotensive initially with blood pressure 81/60 which improved to 106/80 after 1L NS bolus. She was bradycardic with heart rate 51, which improved to 66 currently.   ED Course: pt was found to have alcohol level 163, Tylenol level <10, salicylate level <7.0, WBC 7.3, electrolytes renal function okay. Pending UDS, pending chest x-ray. Pt is placed on SDU for obs. Poison control was contacted.  Review of Systems:   General: no fevers, chills, no body weight gain, has fatigue HEENT: no blurry vision, hearing changes or sore throat Respiratory: no dyspnea, coughing, wheezing CV: no chest pain, no palpitations GI: no nausea, vomiting, abdominal pain, diarrhea, constipation GU: no dysuria, burning on urination, increased urinary frequency, hematuria  Ext: no leg edema Neuro: no unilateral weakness, numbness, or tingling, no vision  change or hearing loss Skin: no rash, no skin tear. MSK: No muscle spasm, no deformity, no limitation of range of movement in spin Heme: No easy bruising.  Travel history: No recent long distant travel. Psych: deneis suicidal or hemocidal ideation.  Allergy: No Known Allergies  Past Medical History:  Diagnosis Date  . Depression     History reviewed. No pertinent surgical history.  Social History:  reports that she has been smoking Cigarettes.  She has been smoking about 0.50 packs per day. She has never used smokeless tobacco. She reports that she drinks alcohol. She reports that she does not use drugs.  Family History:  Family History  Problem Relation Age of Onset  . Schizophrenia Brother   . Depression Brother      Prior to Admission medications   Medication Sig Start Date End Date Taking? Authorizing Provider  cloNIDine (CATAPRES) 0.1 MG tablet Take 0.1 mg by mouth at bedtime as needed (for sleep).   Yes [provider]  citalopram (CELEXA) 20 MG tablet Take 1 tablet (20 mg total) by mouth daily. For depression Patient not taking: Reported on 11/30/2016 12/26/15   Armandina Stammer I, NP  hydrOXYzine (ATARAX/VISTARIL) 25 MG tablet Take 1 tablet (25 mg total) by mouth every 6 (six) hours as needed for itching or anxiety. Patient not taking: Reported on 11/30/2016 12/26/15   Armandina Stammer I, NP  metroNIDAZOLE (FLAGYL) 500 MG tablet Take 1 tablet (500 mg total) by mouth every 12 (twelve) hours. For yeast infections Patient not taking: Reported on 11/30/2016 12/26/15   Sanjuana Kava, NP  sucralfate (  CARAFATE) 1 GM/10ML suspension Take 10 mLs (1 g total) by mouth 4 (four) times daily -  with meals and at bedtime. Patient not taking: Reported on 04/28/2017 11/30/16   Palumbo, April, MD  traZODone (DESYREL) 100 MG tablet Take 1 tablet (100 mg total) by mouth at bedtime as needed for sleep. Patient not taking: Reported on 11/30/2016 12/26/15   Armandina Stammer I, NP    Physical  Exam: Vitals:   04/28/17 0300 04/28/17 0330 04/28/17 0401 04/28/17 0430  BP: 94/64 95/61 (!) 81/60 106/80  Pulse: (!) 51 (!) 55 (!) 58 (!) 49  Resp: 19 19 (!) 21 17  Temp: 97.7 F (36.5 C)     TempSrc: Oral     SpO2: 96% 95% 94% 98%  Weight:      Height:       General: Not in acute distress.  HEENT:       Eyes: PERRL, EOMI, no scleral icterus.       ENT: No discharge from the ears and nose, no pharynx injection, no tonsillar enlargement.        Neck: No JVD, no bruit, no mass felt. Heme: No neck lymph node enlargement. Cardiac: S1/S2, RRR, No murmurs, No gallops or rubs. Respiratory:  No rales, wheezing, rhonchi or rubs. GI: Soft, nondistended, nontender, no rebound pain, no organomegaly, BS present. GU: No hematuria Ext: No pitting leg edema bilaterally. 2+DP/PT pulse bilaterally. Musculoskeletal: No joint deformities, No joint redness or warmth, no limitation of ROM in spin. Skin: No rashes.  Neuro: Alert, oriented X3, cranial nerves II-XII grossly intact, moves all extremities normally.  Psych: deneis suicidal or hemocidal ideation.  Labs on Admission: I have personally reviewed following labs and imaging studies  CBC:  Recent Labs Lab 04/28/17 0256  WBC 7.3  HGB 11.8*  HCT 34.1*  MCV 95.8  PLT 222   Basic Metabolic Panel:  Recent Labs Lab 04/28/17 0256  NA 141  K 3.7  CL 110  CO2 22  GLUCOSE 134*  BUN 7  CREATININE 0.53  CALCIUM 8.7*   GFR: Estimated Creatinine Clearance: 60.8 mL/min (by C-G formula based on SCr of 0.53 mg/dL). Liver Function Tests:  Recent Labs Lab 04/28/17 0256  AST 22  ALT 12*  ALKPHOS 47  BILITOT 0.6  PROT 6.3*  ALBUMIN 4.1   No results for input(s): LIPASE, AMYLASE in the last 168 hours. No results for input(s): AMMONIA in the last 168 hours. Coagulation Profile: No results for input(s): INR, PROTIME in the last 168 hours. Cardiac Enzymes: No results for input(s): CKTOTAL, CKMB, CKMBINDEX, TROPONINI in the last 168  hours. BNP (last 3 results) No results for input(s): PROBNP in the last 8760 hours. HbA1C: No results for input(s): HGBA1C in the last 72 hours. CBG:  Recent Labs Lab 04/28/17 0342  GLUCAP 121*   Lipid Profile: No results for input(s): CHOL, HDL, LDLCALC, TRIG, CHOLHDL, LDLDIRECT in the last 72 hours. Thyroid Function Tests: No results for input(s): TSH, T4TOTAL, FREET4, T3FREE, THYROIDAB in the last 72 hours. Anemia Panel: No results for input(s): VITAMINB12, FOLATE, FERRITIN, TIBC, IRON, RETICCTPCT in the last 72 hours. Urine analysis:    Component Value Date/Time   COLORURINE YELLOW 01/08/2011 2147   APPEARANCEUR CLEAR 01/08/2011 2147   LABSPEC 1.024 01/08/2011 2147   PHURINE 7.5 01/08/2011 2147   GLUCOSEU NEGATIVE 01/08/2011 2147   HGBUR NEGATIVE 01/08/2011 2147   BILIRUBINUR NEGATIVE 01/08/2011 2147   KETONESUR NEGATIVE 01/08/2011 2147   PROTEINUR NEGATIVE 01/08/2011 2147  UROBILINOGEN 1.0 01/08/2011 2147   NITRITE NEGATIVE 01/08/2011 2147   LEUKOCYTESUR  01/08/2011 2147    NEGATIVE MICROSCOPIC NOT DONE ON URINES WITH NEGATIVE PROTEIN, BLOOD, LEUKOCYTES, NITRITE, OR GLUCOSE <1000 mg/dL.   Sepsis Labs: @LABRCNTIP (procalcitonin:4,lacticidven:4) )No results found for this or any previous visit (from the past 240 hour(s)).   Radiological Exams on Admission: No results found.   EKG: Independently reviewed.  Sinus rhythm, QTC 493, anteroseptal infarction pattern.    Assessment/Plan Principal Problem:   Clonidine overdose Active Problems:   Severe episode of recurrent major depressive disorder, without psychotic features (HCC)   Alcohol use   Clonidine overdose: Patient was hypotensive initially with blood pressure 81/60 which improved to 106/80 after 1L NS bolus. She was bradycardic with heart rate 51, which improved to 66 currently. Hemodynamically stable now. Not sure why pt has prescription of clonidine. She denies history of hypertension. Poison control was  contacted: "Danielle from Poison control advised to give IV fluids for hypotension, Atropine for Bradycardia, Dopamine if no change in blood pressure with IV fluids, No Narcan. EKG and standard protocol overdose labs, along with 8 hour monitoring or until pt is Asymptomatic and repeat Tylenol level in 4 hours".   -will place on tele bed for obs -check Bp every hour -Frequent neuro check -Hold clonidine -IVF: 3L NS, then 125 cc/h  Severe episode of recurrent major depressive disorder, without psychotic features (HCC): -pt is not taking home Celexa -Suicidal precaution -Sitter at bed side -please call [facility] in AM  Tobacco abuse and Alcohol abuse:  She states that she drinks alcohol occasionally, denies drinking alcohol every day. Her alcohol level is 163. Currently no signs of withdraw. Will not start CIWA now due to clonidine overdose and risk of CNS suppression. -Nicotine patch -watch for any alcohol withdraw symptoms.   DVT ppx:  SQ Lovenox Code Status: Full code Family Communication: None at bed side.  Disposition Plan:  Anticipate discharge back to previous home environment Consults called:  none Admission status:  SDU/obs   Date of Service 04/28/2017    Lorretta Harp [facility] Pager [phone]  If 7PM-7AM, please contact night-coverage [email or web address] Password [email or web address] 04/28/2017, 5:08 AM

## 2017-04-28 NOTE — ED Notes (Signed)
Bed: RESB Expected date:  Expected time:  Means of arrival:  Comments: EMS-OD 

## 2017-04-28 NOTE — ED Notes (Signed)
Beth from [employer] called and inquiring about VS. Stated, Poison Control would call back this afternoon.

## 2017-04-28 NOTE — ED Triage Notes (Signed)
Pt via EMS.  States she took approximately 15-20 tablets of 0.1 mg Clonidine at 0130.  A&Ox4.  Speech slurred, intermittent somnolence.

## 2017-04-28 NOTE — ED Notes (Signed)
Patient will be approved for 1231. Please call Mora Bellman, RN [phone]) to give 20 minute. Please wait a few minutes as this nurse is at lunch. Thank you.

## 2017-04-28 NOTE — Care Management Note (Signed)
Case Management Note  Patient Details  Name: Courtney Hansen MRN: [id number] Date of Birth: [birth year]  Subjective/Objective:      etoh and drug abuse              Action/Plan: Date:  April 28, 2017 Chart reviewed for concurrent status and case management needs. Will continue to follow patient progress. Discharge Planning: following for needs Expected discharge date: [id number] Marcelle Smiling, BSN, [place], [place]   [phone]  Expected Discharge Date:   (unknown)               Expected Discharge Plan:  Home/Self Care  In-House Referral:  Clinical Social Work  Discharge planning Services  CM Consult  Post Acute Care Choice:    Choice offered to:     DME Arranged:    DME Agency:     HH Arranged:    HH Agency:     Status of Service:  In process, will continue to follow  If discussed at Long Length of Stay Meetings, dates discussed:    Additional Comments:  Golda Acre, RN 04/28/2017, 4:00 PM

## 2017-04-28 NOTE — Progress Notes (Signed)
BP 112/76   Pulse (!) 48   Temp 97.7 F (36.5 C) (Oral)   Resp 17   Ht 4\' 10"  (1.473 m)   Wt 40.8 kg (90 lb)   LMP  (LMP Unknown)   SpO2 99%   BMI 18.81 kg/m  She remains asymptomatic, she only weighs 40 kg so probably her blood pressure runs around 100. She is having a good urine output. I agree with plan as laid out by admitting physician. We'll continue to monitor and stepdown, I have already consulted psychiatry.

## 2017-04-28 NOTE — Consult Note (Signed)
[facility] Consult   Reason for Consult:  Intentional drug overdose Referring Physician: Dr. Aileen Fass Patient Identification: Courtney Hansen MRN:  [id number] Principal Diagnosis: Clonidine overdose Diagnosis:   Patient Active Problem List   Diagnosis Date Noted  . Clonidine overdose [T46.5X1A] 04/28/2017  . Alcohol use [Z78.9] 04/28/2017  . Intentional drug overdose ([place]) [T50.902A]   . Severe episode of recurrent major depressive disorder, without psychotic features ([place]) [F33.2]   . MDD (major depressive disorder) [F32.9] 12/20/2015    Total Time spent with patient: 1 hour  Subjective:   Courtney Hansen is a 41 y.o. female patient admitted with status post intentional overdose.  HPI:  Courtney Hansen is a 41 y.o. female with medical history significant of depression, tobacco abuse, alcohol use, admitted to [facility] after presents with clonidine overdose. She reported she has been diagnosed with major depressive disorder, anxiety disorder, polysubstance abuse including alcohol abuse and cocaine abuse and has been noncompliant with her medication over 3 years. Patient reportedly [facility] [place] [facility] ago. Patient reported she had in and out of the relationship with her boyfriend for the last 3 years. Her boyfriend is veteran and she had an argument with him before she took the intentional overdose of clonidine. Patient minimizes suicidal intention during this evaluation. Reportedly patient taken 15-20 tablets of clonidine 0.1 mg and ingested alcohol and become somnolent the time of contact with the emergency department physician. Based on my evaluation patient meet criteria for inpatient psychiatric hospitalization for crisis stabilization, safety monitoring and medication management. Patient blood alcohol level 163, Tylenol level <57, salicylate level <2.6, WBC 7.3, electrolytes renal function okay. Patient UDS - positive for  cocaine, tetrahydrocannabinol.  Past Psychiatric History: Patient has been diagnosed with severe major depressive disorder, alcohol abuse, history of behavioral health hospitalization in April 2017.  Risk to Self: Is patient at risk for suicide?: No Risk to Others:   Prior Inpatient Therapy:   Prior Outpatient Therapy:    Past Medical History:  Past Medical History:  Diagnosis Date  . Depression    History reviewed. No pertinent surgical history. Family History:  Family History  Problem Relation Age of Onset  . Schizophrenia Brother   . Depression Brother    Family Psychiatric  History: unknown. Social History:  History  Alcohol Use  . Yes    Comment: daily      History  Drug Use No    Social History   Social History  . Marital status: Divorced    Spouse name: N/A  . Number of children: N/A  . Years of education: N/A   Social History Main Topics  . Smoking status: Current Every Day Smoker    Packs/day: 0.50    Types: Cigarettes  . Smokeless tobacco: Never Used  . Alcohol use Yes     Comment: daily   . Drug use: No  . Sexual activity: Yes    Birth control/ protection: None   Other Topics Concern  . None   Social History Narrative  . None   Additional Social History:    Allergies:  No Known Allergies  Labs:  Results for orders placed or performed during the hospital encounter of 04/28/17 (from the past 48 hour(s))  Comprehensive metabolic panel     Status: Abnormal   Collection Time: 04/28/17  2:56 AM  Result Value Ref Range   Sodium 141 135 - 145 mmol/L   Potassium  3.7 3.5 - 5.1 mmol/L   Chloride 110 101 - 111 mmol/L   CO2 22 22 - 32 mmol/L   Glucose, Bld 134 (H) 65 - 99 mg/dL   BUN 7 6 - 20 mg/dL   Creatinine, Ser 0.53 0.44 - 1.00 mg/dL   Calcium 8.7 (L) 8.9 - 10.3 mg/dL   Total Protein 6.3 (L) 6.5 - 8.1 g/dL   Albumin 4.1 3.5 - 5.0 g/dL   AST 22 15 - 41 U/L   ALT 12 (L) 14 - 54 U/L   Alkaline Phosphatase 47 38 - 126 U/L   Total Bilirubin  0.6 0.3 - 1.2 mg/dL   GFR calc non Af Amer >60 >60 mL/min   GFR calc Af Amer >60 >60 mL/min    Comment: (NOTE) The eGFR has been calculated using the CKD EPI equation. This calculation has not been validated in all clinical situations. eGFR's persistently <60 mL/min signify possible Chronic Kidney Disease.    Anion gap 9 5 - 15  Ethanol     Status: Abnormal   Collection Time: 04/28/17  2:56 AM  Result Value Ref Range   Alcohol, Ethyl (B) 163 (H) <5 mg/dL    Comment:        LOWEST DETECTABLE LIMIT FOR SERUM ALCOHOL IS 5 mg/dL FOR MEDICAL PURPOSES ONLY   Salicylate level     Status: None   Collection Time: 04/28/17  2:56 AM  Result Value Ref Range   Salicylate Lvl <1.6 2.8 - 30.0 mg/dL  Acetaminophen level     Status: Abnormal   Collection Time: 04/28/17  2:56 AM  Result Value Ref Range   Acetaminophen (Tylenol), Serum <10 (L) 10 - 30 ug/mL    Comment:        THERAPEUTIC CONCENTRATIONS VARY SIGNIFICANTLY. A RANGE OF 10-30 ug/mL MAY BE AN EFFECTIVE CONCENTRATION FOR MANY PATIENTS. HOWEVER, SOME ARE BEST TREATED AT CONCENTRATIONS OUTSIDE THIS RANGE. ACETAMINOPHEN CONCENTRATIONS >150 ug/mL AT 4 HOURS AFTER INGESTION AND >50 ug/mL AT 12 HOURS AFTER INGESTION ARE OFTEN ASSOCIATED WITH TOXIC REACTIONS.   cbc     Status: Abnormal   Collection Time: 04/28/17  2:56 AM  Result Value Ref Range   WBC 7.3 4.0 - 10.5 K/uL   RBC 3.56 (L) 3.87 - 5.11 MIL/uL   Hemoglobin 11.8 (L) 12.0 - 15.0 g/dL   HCT 34.1 (L) 36.0 - 46.0 %   MCV 95.8 78.0 - 100.0 fL   MCH 33.1 26.0 - 34.0 pg   MCHC 34.6 30.0 - 36.0 g/dL   RDW 14.0 11.5 - 15.5 %   Platelets 222 150 - 400 K/uL  CBG monitoring, ED     Status: Abnormal   Collection Time: 04/28/17  3:42 AM  Result Value Ref Range   Glucose-Capillary 121 (H) 65 - 99 mg/dL  CBG monitoring, ED     Status: Abnormal   Collection Time: 04/28/17  8:03 AM  Result Value Ref Range   Glucose-Capillary 107 (H) 65 - 99 mg/dL  Basic metabolic panel      Status: Abnormal   Collection Time: 04/28/17  8:09 AM  Result Value Ref Range   Sodium 142 135 - 145 mmol/L   Potassium 3.6 3.5 - 5.1 mmol/L   Chloride 114 (H) 101 - 111 mmol/L   CO2 21 (L) 22 - 32 mmol/L   Glucose, Bld 106 (H) 65 - 99 mg/dL   BUN 6 6 - 20 mg/dL   Creatinine, Ser 0.41 (L) 0.44 - 1.00 mg/dL  Calcium 8.1 (L) 8.9 - 10.3 mg/dL   GFR calc non Af Amer >60 >60 mL/min   GFR calc Af Amer >60 >60 mL/min    Comment: (NOTE) The eGFR has been calculated using the CKD EPI equation. This calculation has not been validated in all clinical situations. eGFR's persistently <60 mL/min signify possible Chronic Kidney Disease.    Anion gap 7 5 - 15  CBC     Status: Abnormal   Collection Time: 04/28/17  8:09 AM  Result Value Ref Range   WBC 6.4 4.0 - 10.5 K/uL   RBC 3.24 (L) 3.87 - 5.11 MIL/uL   Hemoglobin 10.9 (L) 12.0 - 15.0 g/dL   HCT 31.6 (L) 36.0 - 46.0 %   MCV 97.5 78.0 - 100.0 fL   MCH 33.6 26.0 - 34.0 pg   MCHC 34.5 30.0 - 36.0 g/dL   RDW 14.4 11.5 - 15.5 %   Platelets 192 150 - 400 K/uL    Current Facility-Administered Medications  Medication Dose Route Frequency Provider Last Rate Last Dose  . 0.9 %  sodium chloride infusion   Intravenous Continuous Niu, Xilin, MD 125 mL/hr at 04/28/17 1213    . acetaminophen (TYLENOL) tablet 650 mg  650 mg Oral Q6H PRN Niu, Xilin, MD       Or  . acetaminophen (TYLENOL) suppository 650 mg  650 mg Rectal Q6H PRN Niu, Xilin, MD      . enoxaparin (LOVENOX) injection 30 mg  30 mg Subcutaneous Q24H Niu, Xilin, MD      . nicotine (NICODERM CQ - dosed in mg/24 hours) patch 21 mg  21 mg Transdermal Daily Niu, Xilin, MD      . ondansetron (ZOFRAN) tablet 4 mg  4 mg Oral Q6H PRN Niu, Xilin, MD       Or  . ondansetron (ZOFRAN) injection 4 mg  4 mg Intravenous Q6H PRN Niu, Xilin, MD      . zolpidem (AMBIEN) tablet 5 mg  5 mg Oral QHS PRN Niu, Xilin, MD        Musculoskeletal: Strength & Muscle Tone: within normal limits Gait & Station:  normal Patient leans: N/A  Psychiatric Specialty Exam: Physical Exam as per history and physical   ROS patient complaining about sleepiness and drowsiness and also conflict with her boyfriend and endorses status post intentional overdose of clonidine reportedly impulsive nature and under the influence of alcohol, cocaine and tetrahydrocannabinol. No Fever-chills, No Headache, No changes with Vision or hearing, reports vertigo No problems swallowing food or Liquids, No Chest pain, Cough or Shortness of Breath, No Abdominal pain, No Nausea or Vommitting, Bowel movements are regular, No Blood in stool or Urine, No dysuria, No new skin rashes or bruises, No new joints pains-aches,  No new weakness, tingling, numbness in any extremity, No recent weight gain or loss, No polyuria, polydypsia or polyphagia,  A full 10 point Review of Systems was done, except as stated above, all other Review of Systems were negative.  Blood pressure 105/63, pulse (!) 48, temperature 97.7 F (36.5 C), temperature source Oral, resp. rate (!) 22, height 4' 10" (1.473 m), weight 40.9 kg (90 lb 2.7 oz), SpO2 94 %.Body mass index is 18.85 kg/m.  General Appearance: Guarded  Eye Contact:  Good  Speech:  Clear and Coherent  Volume:  Normal  Mood:  Depressed and Irritable  Affect:  Congruent and Depressed  Thought Process:  Coherent and Goal Directed  Orientation:  Full (Time, Place, and Person)    Thought Content:  Rumination  Suicidal Thoughts:  Yes.  with intent/plan, minimizes at this visit,s says impulsive action under influence.  Homicidal Thoughts:  No  Memory:  Immediate;   Good Recent;   Fair Remote;   Fair  Judgement:  Impaired  Insight:  Fair  Psychomotor Activity:  Decreased  Concentration:  Concentration: Good and Attention Span: Fair  Recall:  Good  Fund of Knowledge:  Good  Language:  Good  Akathisia:  Negative  Handed:  Right  AIMS (if indicated):     Assets:  Communication Skills Desire  for Improvement Housing Intimacy Leisure Time Physical Health Resilience Social Support  ADL's:  Intact  Cognition:  WNL  Sleep:        Treatment Plan Summary: Daily contact with patient to assess and evaluate symptoms and progress in treatment and Medication management   Recommendation: Continue safety sitter as patient cannot contract for safety at this time Patient will be admitted to medical floor for medical stabilization Was referred to the LCSW for appropriate inpatient psychiatric placement when medically stable. Recommended no psychotropic medication has patient had intentional overdose Monitor for the alcohol withdrawal symptoms and may use Ativan protocol.  Disposition: Recommend psychiatric Inpatient admission when medically cleared. Supportive therapy provided about ongoing stressors.   , MD 04/28/2017 3:24 PM 

## 2017-04-28 NOTE — ED Notes (Signed)
Information charted at 1130 was charted on wrong patient

## 2017-04-28 NOTE — ED Notes (Signed)
Poison control notified of pt with ingestion of appx 15-20 Clonidine 0.1mg  at 0130. Danielle from Poison control advised to give IV fluids for hypotension, Atropine for Bradycardia, Dopamine if no change in blood pressure with IV fluids, No Narcan. EKG and standard protocol overdose labs, along with 8 hour monitoring or until pt is Asymptomatic and repeat Tylenol level in 4 hours. Dr.Horton notified of recommendations.

## 2017-04-29 DIAGNOSIS — T465X2A Poisoning by other antihypertensive drugs, intentional self-harm, initial encounter: Principal | ICD-10-CM

## 2017-04-29 DIAGNOSIS — F332 Major depressive disorder, recurrent severe without psychotic features: Secondary | ICD-10-CM

## 2017-04-29 DIAGNOSIS — Z789 Other specified health status: Secondary | ICD-10-CM

## 2017-04-29 LAB — HIV ANTIBODY (ROUTINE TESTING W REFLEX): HIV Screen 4th Generation wRfx: NONREACTIVE

## 2017-04-29 MED ORDER — ALUM & MAG HYDROXIDE-SIMETH 200-200-20 MG/5ML PO SUSP
30.0000 mL | ORAL | Status: DC | PRN
  Administered 2017-04-29: 30 mL via ORAL
  Filled 2017-04-29: qty 30

## 2017-04-29 NOTE — Progress Notes (Signed)
Pt transferred from ICU going to SNF, CSW following.

## 2017-04-29 NOTE — Progress Notes (Signed)
CSW discussed patient discharge plan with physician. The Psychiatrist recommends inpatient hospitalization  for further evaluation. Patient reports she does not want to go to inpatient hospital. Psychiatrist states to place patient under IVC, she cannot contract for her own safety at this time. CSW assisted physician w/ paper work. IVC faxed to Magistrate. Magistrate Received. Patient to be served by [employer].   Vivi Barrack, Theresia Majors, MSW Clinical Social Worker 5E and Psychiatric Service Line [phone] 04/29/2017  11:28 AM

## 2017-04-29 NOTE — Progress Notes (Signed)
PROGRESS NOTE  JASON HAUGE ZOX:[id number] DOB: 1976/05/03 DOA: 04/28/2017 PCP: [name], NP   LOS: 1 day   Brief Narrative / Interim history: 41 year old female with history of depression, tobacco abuse, alcohol use, who came to the hospital on 8/21 with clonidine overdose  Assessment & Plan: Principal Problem:   Clonidine overdose Active Problems:   Severe episode of recurrent major depressive disorder, without psychotic features (HCC)   Alcohol use   Clonidine overdose -She is now about 36 hours out of her injection, her blood pressure is in the 90s-100, patient states that this is normal for her.  She is asymptomatic.  She is bradycardic at times, and patient tells me that she has had low heart rates in the past.  She is asymptomatic.  Vital signs reviewed from her prior hospital stay at [facility] in 2017 show blood pressure into the 100s heart rate into the 50s/low 60s. Will continue to monitor in the inpatient setting on telemetry for 24 more hours, but anticipate to be cleared medically on 8/23  Depression/?  Suicide intent -Patient tells me that she uses clonidine PRN for anxiety at nighttime, usually few times a week but not every day.  She is not sure why she took too many pills, she just felt the need to -Psychiatry recommended inpatient psychiatric admission once medically cleared, however patient tells me that this she is not interested in this.  Yesterday she mentions that she wanted to leave AGAINST MEDICAL ADVICE.  Given current psychiatric recommendations, patient will need to be IVC as it is felt that she is a danger to herself  Polysubstance abuse -UDS positive for cocaine and marijuana, also reports that history of alcohol use however patient says she is not abusing alcohol. She does not appear to be withdrawing   DVT prophylaxis: Lovenox Code Status: Full code Family Communication: Friend at bedside Disposition Plan: Transfer to telemetry  Consultants:    Psychiatry  Procedures:   None   Antimicrobials:  None   Subjective: - no chest pain, shortness of breath, no abdominal pain, nausea or vomiting.  No lightheadedness or dizziness  Objective: Vitals:   04/29/17 0600 04/29/17 0800 04/29/17 0900 04/29/17 1000  BP: (!) 99/53 98/74 (!) 94/55 112/73  Pulse: (!) 44 65 (!) 53 (!) 55  Resp: (!) 21 19 (!) 23 19  Temp:      TempSrc:      SpO2: 99% 93% 93% 97%  Weight:      Height:        Intake/Output Summary (Last 24 hours) at 04/29/17 1019 Last data filed at 04/29/17 1000  Gross per 24 hour  Intake          5372.92 ml  Output             1575 ml  Net          3797.92 ml   Filed Weights   04/28/17 0252 04/28/17 1428  Weight: 40.8 kg (90 lb) 40.9 kg (90 lb 2.7 oz)    Examination:  Vitals:   04/29/17 0600 04/29/17 0800 04/29/17 0900 04/29/17 1000  BP: (!) 99/53 98/74 (!) 94/55 112/73  Pulse: (!) 44 65 (!) 53 (!) 55  Resp: (!) 21 19 (!) 23 19  Temp:      TempSrc:      SpO2: 99% 93% 93% 97%  Weight:      Height:        Constitutional: NAD Eyes: lids and conjunctivae normal  ENMT: Mucous membranes are moist. No oropharyngeal exudates Respiratory: clear to auscultation bilaterally, no wheezing, no crackles. Normal respiratory effort. No accessory muscle use.  Cardiovascular: Regular rate and rhythm, no murmurs / rubs / gallops. No LE edema. 2+ pedal pulses Abdomen: no tenderness. Bowel sounds positive.  Neurologic: non focal    Data Reviewed: I have independently reviewed following labs and imaging studies   CBC:  Recent Labs Lab 04/28/17 0256 04/28/17 0809  WBC 7.3 6.4  HGB 11.8* 10.9*  HCT 34.1* 31.6*  MCV 95.8 97.5  PLT 222 192   Basic Metabolic Panel:  Recent Labs Lab 04/28/17 0256 04/28/17 0809  NA 141 142  K 3.7 3.6  CL 110 114*  CO2 22 21*  GLUCOSE 134* 106*  BUN 7 6  CREATININE 0.53 0.41*  CALCIUM 8.7* 8.1*   GFR: Estimated Creatinine Clearance: 61 mL/min (A) (by C-G formula based  on SCr of 0.41 mg/dL (L)). Liver Function Tests:  Recent Labs Lab 04/28/17 0256  AST 22  ALT 12*  ALKPHOS 47  BILITOT 0.6  PROT 6.3*  ALBUMIN 4.1   No results for input(s): LIPASE, AMYLASE in the last 168 hours. No results for input(s): AMMONIA in the last 168 hours. Coagulation Profile: No results for input(s): INR, PROTIME in the last 168 hours. Cardiac Enzymes: No results for input(s): CKTOTAL, CKMB, CKMBINDEX, TROPONINI in the last 168 hours. BNP (last 3 results) No results for input(s): PROBNP in the last 8760 hours. HbA1C: No results for input(s): HGBA1C in the last 72 hours. CBG:  Recent Labs Lab 04/28/17 0342 04/28/17 0803  GLUCAP 121* 107*   Lipid Profile: No results for input(s): CHOL, HDL, LDLCALC, TRIG, CHOLHDL, LDLDIRECT in the last 72 hours. Thyroid Function Tests: No results for input(s): TSH, T4TOTAL, FREET4, T3FREE, THYROIDAB in the last 72 hours. Anemia Panel: No results for input(s): VITAMINB12, FOLATE, FERRITIN, TIBC, IRON, RETICCTPCT in the last 72 hours. Urine analysis:    Component Value Date/Time   COLORURINE YELLOW 01/08/2011 2147   APPEARANCEUR CLEAR 01/08/2011 2147   LABSPEC 1.024 01/08/2011 2147   PHURINE 7.5 01/08/2011 2147   GLUCOSEU NEGATIVE 01/08/2011 2147   HGBUR NEGATIVE 01/08/2011 2147   BILIRUBINUR NEGATIVE 01/08/2011 2147   KETONESUR NEGATIVE 01/08/2011 2147   PROTEINUR NEGATIVE 01/08/2011 2147   UROBILINOGEN 1.0 01/08/2011 2147   NITRITE NEGATIVE 01/08/2011 2147   LEUKOCYTESUR  01/08/2011 2147    NEGATIVE MICROSCOPIC NOT DONE ON URINES WITH NEGATIVE PROTEIN, BLOOD, LEUKOCYTES, NITRITE, OR GLUCOSE <1000 mg/dL.   Sepsis Labs: Invalid input(s): PROCALCITONIN, LACTICIDVEN  Recent Results (from the past 240 hour(s))  MRSA PCR Screening     Status: None   Collection Time: 04/28/17  2:40 PM  Result Value Ref Range Status   MRSA by PCR NEGATIVE NEGATIVE Final    Comment:        The GeneXpert MRSA Assay (FDA approved for  NASAL specimens only), is one component of a comprehensive MRSA colonization surveillance program. It is not intended to diagnose MRSA infection nor to guide or monitor treatment for MRSA infections.       Radiology Studies: No results found.   Scheduled Meds: . enoxaparin (LOVENOX) injection  30 mg Subcutaneous Q24H  . nicotine  21 mg Transdermal Daily   Continuous Infusions: . sodium chloride 125 mL/hr at 04/29/17 1000    Pamella Pert, MD, PhD [facility] Pager [phone]  If 7PM-7AM, please contact night-coverage [email or web address] Password [facility] 04/29/2017, 10:19 AM

## 2017-04-29 NOTE — Clinical Social Work Note (Signed)
Clinical Social Work Assessment  Patient Details  Name: Courtney Hansen MRN: [id number] Date of Birth: Sep 30, 1975  Date of referral:  04/29/17               Reason for consult:  Suicide Risk/Attempt                Permission sought to share information with:    Permission granted to share information::     Name::        Agency::     Relationship::     Contact Information:     Housing/Transportation Living arrangements for the past 2 months:  Apartment Source of Information:  Patient Patient Interpreter Needed:  None Criminal Activity/Legal Involvement Pertinent to Current Situation/Hospitalization:  No - Comment as needed Significant Relationships:  Significant Other, Adult Children Lives with:  Significant Other Do you feel safe going back to the place where you live?  Yes Need for family participation in patient care:  No   Care giving concerns:  Intentional Overdose.   Social Worker assessment / plan:  CSW met with patient at bedside, explain role and reason for visit- explain IVC, and complete assessment. Patient guarded but agreeable to talk. Patient express feeling upset about having to go to inpatient hospital.  Patient reports she recently moved in with her boyfriend and prior to she was "living on the streets, using drugs. " She reports she had an argument with her boyfriend and was drinking, then took a hand full of pills. Patient reports she has attempted to overdose in the past but denies this time was intentional.  Patient reports last year she went to [facility] for treatment. Patient reports she is upset and displeased with her psychiatric evaluation and would rather complete a outpatient program. Patient reports she has an intake assessment with [facility] on Monday for therapy and medication management. She reports she stop seeing her dual diagnosis therapist about six months ago.   Plan: Inpatient psychiatric placement once medically stable. Under  IVC.   Employment status:  [employer] information:  Self Pay (Medicaid Pending) PT Recommendations:  Not assessed at this time Information / Referral to community resources:  Inpatient Psychiatric Care (Comment Required)  Patient/Family's Response to care:  Responding to medical care, patient does not feel inpatient placement will help her.  Patient/Family's Understanding of and Emotional Response to Diagnosis, Current Treatment, and Prognosis:  "I do not feel I need to go to another hospital." Patient disagrees with inpatient recommendation for further treatment. Patient is currently under IVC.   Emotional Assessment Appearance:  Developmentally appropriate Attitude/Demeanor/Rapport:  Guarded Affect (typically observed):  Agitated, Depressed Orientation:  Oriented to Self, Oriented to Place, Oriented to  Time, Oriented to Situation Alcohol / Substance use:  Not Applicable Psych involvement (Current and /or in the community):  Yes (Recommends Inaptient psychiatric placement )  Discharge Needs  Concerns to be addressed:    Readmission within the last 30 days:  Yes Current discharge risk:  None, Psychiatric Illness, Substance Abuse Barriers to Discharge:  Continued Medical Work up   [employer], LCSW 04/29/2017, 1:26 PM

## 2017-04-29 NOTE — Progress Notes (Signed)
Patient transferred to room 1424 via wheelchair.Suicide sitter with patient. Report given to RN.  IVC papers placed on chart.Marland Kitchen

## 2017-04-29 NOTE — Progress Notes (Signed)
Patient requested to stop IV fluid, c/o bloating. Eating and drinking at this time with boyfriend per patient. Abd  Soft and non distended, Bowel sound active x 4 quad upon assessment. No edema noted. Patient re-educated on her diagnosis and the reason for IV fluid per NP on call. Patient insist to stop IV fluid and to talk  to MD in the morning. IV fluid stopped at this time.

## 2017-04-30 ENCOUNTER — Inpatient Hospital Stay (HOSPITAL_COMMUNITY): Admission: AD | Admit: 2017-04-30 | Payer: Federal, State, Local not specified - Other | Admitting: Psychiatry

## 2017-04-30 LAB — CREATININE, SERUM: CREATININE: 0.5 mg/dL (ref 0.44–1.00)

## 2017-04-30 LAB — GLUCOSE, CAPILLARY: Glucose-Capillary: 93 mg/dL (ref 65–99)

## 2017-04-30 NOTE — Plan of Care (Signed)
Patient states she did not attempt suicide - she called 911 immediately after after taking pills and was just trying to get boyfriend's attention.  Educated that since she took meds INTENTIONALLY, this clarifies that she attempted suicide (reguardless of reason).  Patient understands, but disagrees with definition.

## 2017-04-30 NOTE — Progress Notes (Signed)
Patient ran from building. CSW helped locate paperwork left at nurses station on cart.

## 2017-04-30 NOTE — Progress Notes (Addendum)
CSW following for patient discharge needs. CSW spoke with AC-Tina at [facility] about placement.  CSW faxed IVC to [phone]. AC will inform CSW when bed is available.   Patient has been accepted to: [facility] Room 303 bed 1 Nurse please call report to:[phone]  12:45PM [employer] called for Transport   Vivi Barrack, Theresia Majors, MSW Clinical Social Worker 5E and Psychiatric Service Line [phone] 04/30/2017  10:24 AM

## 2017-04-30 NOTE — Discharge Summary (Signed)
Physician Discharge Summary  [name]:[id number] DOB: July 26, 1976 DOA: 04/28/2017  PCP: Courtney Sharps, NP  Admit date: 04/28/2017 Discharge date: 04/30/2017  Admitted From: Home Disposition: Behavioral health  Recommendations for Outpatient Follow-up:  Patient to be discharged to inpatient psychiatry  Home Health: None Equipment/Devices: None  Discharge Condition: Stable CODE STATUS: Full code Diet recommendation: Regular  HPI: Per Dr. Clyde Lundborg, Courtney Hansen is a 41 y.o. female with medical history significant of depression, tobacco abuse, alcohol use, who presents with clonidine overdose. Per EMS, patient took 15-20 tablets of 0.1 mg clonidine at 0130. Also reported alcohol ingestion. Per EDP, patient was initially somnolent but arousable. When I saw pt in ED, she is fully alert, oriented x 3. She reported to EDP that she had mild SOB, but denies SOB to me. She also denies chest pain, cough, nausea, vomiting, diarrhea, abdominal pain, symptoms of UTI or unilateral weakness. She is very emotional, and crying. She complains of of feeling cold in left arm where the IV fluid is going on. She states that she drinks alcohol occasionally, denies drinking alcohol every day. She denies suicidal or homicidal ideations to me. Patient was hypotensive initially with blood pressure 81/60 which improved to 106/80 after 1L NS bolus. She was bradycardic with heart rate 51, which improved to 66 currently. ED Course: pt was found to have alcohol level 163, Tylenol level <10, salicylate level <7.0, WBC 7.3, electrolytes renal function okay. Pending UDS, pending chest x-ray. Pt is placed on SDU for obs. Poison control was contacted.  Hospital Course: Discharge Diagnoses:  Principal Problem:   Clonidine overdose Active Problems:   Severe episode of recurrent major depressive disorder, without psychotic features (HCC)   Alcohol use   Clonidine overdose -patient was admitted to the ICU with  clonidine overdose.  She was initially hypotensive and bradycardic, however asymptomatic.  She responded well to IV fluids, she was given supportive treatment and her heart rate and blood pressure have improved.  Of note, she tends to run in the 50s and 60s in terms of her heart rate based on prior vital signs of last year.  She is also telling me that her blood pressure normally runs in the 90s-100 systolic.  She is asymptomatic with this.  Will discontinue clonidine on discharge Depression/suicide intent -patient was evaluated by psychiatry, recommended inpatient psychiatric evaluation.  Patient not in agreement with the plan, but given concern for danger to self psychiatry recommended involuntary commitment.  She will be discharged from the hospital to behavioral health for further treatment  Polysubstance abuse -urine drug screen positive for cocaine and marijuana, reported history of alcohol use.  She did not appear to have any withdrawal symptoms.  Further management for her polysubstance abuse per psychiatry   Discharge Instructions   Allergies as of 04/30/2017   No Known Allergies     Medication List    STOP taking these medications   cloNIDine 0.1 MG tablet Commonly known as:  CATAPRES   hydrOXYzine 25 MG tablet Commonly known as:  ATARAX/VISTARIL   metroNIDAZOLE 500 MG tablet Commonly known as:  FLAGYL     TAKE these medications   citalopram 20 MG tablet Commonly known as:  CELEXA Take 1 tablet (20 mg total) by mouth daily. For depression   sucralfate 1 GM/10ML suspension Commonly known as:  CARAFATE Take 10 mLs (1 g total) by mouth 4 (four) times daily -  with meals and at bedtime.   traZODone 100 MG  tablet Commonly known as:  DESYREL Take 1 tablet (100 mg total) by mouth at bedtime as needed for sleep.       No Known Allergies  Consultations:  Psychiatry  Procedures/Studies:   No results found.   Subjective: - no chest pain, shortness of breath, no  abdominal pain, nausea or vomiting.   Discharge Exam: Vitals:   04/29/17 2041 04/30/17 0412  BP: 132/78 126/72  Pulse: 60 (!) 57  Resp: 18 16  Temp: 97.7 F (36.5 C) 97.9 F (36.6 C)  SpO2: 100% 98%   Vitals:   04/29/17 1000 04/29/17 1200 04/29/17 2041 04/30/17 0412  BP: 112/73 97/61 132/78 126/72  Pulse: (!) 55 68 60 (!) 57  Resp: 19 (!) 23 18 16   Temp:   97.7 F (36.5 C) 97.9 F (36.6 C)  TempSrc:   Axillary Oral  SpO2: 97% 96% 100% 98%  Weight:      Height:        General: Pt is alert, awake, not in acute distress Cardiovascular: RRR, S1/S2 +, no rubs, no gallops Respiratory: CTA bilaterally, no wheezing, no rhonchi Abdominal: Soft, NT, ND, bowel sounds + Extremities: no edema, no cyanosis    The results of significant diagnostics from this hospitalization (including imaging, microbiology, ancillary and laboratory) are listed below for reference.     Microbiology: Recent Results (from the past 240 hour(s))  MRSA PCR Screening     Status: None   Collection Time: 04/28/17  2:40 PM  Result Value Ref Range Status   MRSA by PCR NEGATIVE NEGATIVE Final    Comment:        The GeneXpert MRSA Assay (FDA approved for NASAL specimens only), is one component of a comprehensive MRSA colonization surveillance program. It is not intended to diagnose MRSA infection nor to guide or monitor treatment for MRSA infections.      Labs: BNP (last 3 results) No results for input(s): BNP in the last 8760 hours. Basic Metabolic Panel:  Recent Labs Lab 04/28/17 0256 04/28/17 0809 04/30/17 0408  NA 141 142  --   K 3.7 3.6  --   CL 110 114*  --   CO2 22 21*  --   GLUCOSE 134* 106*  --   BUN 7 6  --   CREATININE 0.53 0.41* 0.50  CALCIUM 8.7* 8.1*  --    Liver Function Tests:  Recent Labs Lab 04/28/17 0256  AST 22  ALT 12*  ALKPHOS 47  BILITOT 0.6  PROT 6.3*  ALBUMIN 4.1   No results for input(s): LIPASE, AMYLASE in the last 168 hours. No results for  input(s): AMMONIA in the last 168 hours. CBC:  Recent Labs Lab 04/28/17 0256 04/28/17 0809  WBC 7.3 6.4  HGB 11.8* 10.9*  HCT 34.1* 31.6*  MCV 95.8 97.5  PLT 222 192   Cardiac Enzymes: No results for input(s): CKTOTAL, CKMB, CKMBINDEX, TROPONINI in the last 168 hours. BNP: Invalid input(s): POCBNP CBG:  Recent Labs Lab 04/28/17 0342 04/28/17 0803 04/30/17 0812  GLUCAP 121* 107* 93   D-Dimer No results for input(s): DDIMER in the last 72 hours. Hgb A1c No results for input(s): HGBA1C in the last 72 hours. Lipid Profile No results for input(s): CHOL, HDL, LDLCALC, TRIG, CHOLHDL, LDLDIRECT in the last 72 hours. Thyroid function studies No results for input(s): TSH, T4TOTAL, T3FREE, THYROIDAB in the last 72 hours.  Invalid input(s): FREET3 Anemia work up No results for input(s): VITAMINB12, FOLATE, FERRITIN, TIBC, IRON, RETICCTPCT in  the last 72 hours. Urinalysis    Component Value Date/Time   COLORURINE YELLOW 01/08/2011 2147   APPEARANCEUR CLEAR 01/08/2011 2147   LABSPEC 1.024 01/08/2011 2147   PHURINE 7.5 01/08/2011 2147   GLUCOSEU NEGATIVE 01/08/2011 2147   HGBUR NEGATIVE 01/08/2011 2147   BILIRUBINUR NEGATIVE 01/08/2011 2147   KETONESUR NEGATIVE 01/08/2011 2147   PROTEINUR NEGATIVE 01/08/2011 2147   UROBILINOGEN 1.0 01/08/2011 2147   NITRITE NEGATIVE 01/08/2011 2147   LEUKOCYTESUR  01/08/2011 2147    NEGATIVE MICROSCOPIC NOT DONE ON URINES WITH NEGATIVE PROTEIN, BLOOD, LEUKOCYTES, NITRITE, OR GLUCOSE <1000 mg/dL.   Sepsis Labs Invalid input(s): PROCALCITONIN,  WBC,  LACTICIDVEN Microbiology Recent Results (from the past 240 hour(s))  MRSA PCR Screening     Status: None   Collection Time: 04/28/17  2:40 PM  Result Value Ref Range Status   MRSA by PCR NEGATIVE NEGATIVE Final    Comment:        The GeneXpert MRSA Assay (FDA approved for NASAL specimens only), is one component of a comprehensive MRSA colonization surveillance program. It is  not intended to diagnose MRSA infection nor to guide or monitor treatment for MRSA infections.      Time coordinating discharge: 35 minutes  SIGNED:  Pamella Pert, MD  [facility] 04/30/2017, 12:23 PM Pager [phone]  If 7PM-7AM, please contact night-coverage [email or web address] Password [email or web address]

## 2017-04-30 NOTE — Plan of Care (Signed)
Patient removed own IV and tele and ran out of the room, indicating she was leaving the hospital NOW. Sitter verbally told RN and both followed patient (only clothed in hosp gown) down the stairs and to the front hospital circle - Since patient was IVC and given orders to go the [facility]. However, patient had car in circle to pick her up and speed from the hospital lot.  Security informed and License plate # given.  Hosp informed of situation.

## 2017-06-09 ENCOUNTER — Emergency Department (HOSPITAL_COMMUNITY)
Admission: EM | Admit: 2017-06-09 | Discharge: 2017-06-10 | Disposition: A | Discharge status: Home / Self Care | Payer: Self-pay | Attending: Emergency Medicine | Admitting: Emergency Medicine

## 2017-06-09 ENCOUNTER — Encounter (HOSPITAL_COMMUNITY): Payer: Self-pay | Admitting: Emergency Medicine

## 2017-06-09 DIAGNOSIS — F1020 Alcohol dependence, uncomplicated: Secondary | ICD-10-CM | POA: Diagnosis present

## 2017-06-09 DIAGNOSIS — F17210 Nicotine dependence, cigarettes, uncomplicated: Secondary | ICD-10-CM | POA: Insufficient documentation

## 2017-06-09 DIAGNOSIS — F332 Major depressive disorder, recurrent severe without psychotic features: Secondary | ICD-10-CM | POA: Insufficient documentation

## 2017-06-09 DIAGNOSIS — T450X2A Poisoning by antiallergic and antiemetic drugs, intentional self-harm, initial encounter: Secondary | ICD-10-CM | POA: Insufficient documentation

## 2017-06-09 DIAGNOSIS — T50902A Poisoning by unspecified drugs, medicaments and biological substances, intentional self-harm, initial encounter: Secondary | ICD-10-CM

## 2017-06-09 LAB — COMPREHENSIVE METABOLIC PANEL
ALBUMIN: 4.9 g/dL (ref 3.5–5.0)
ALT: 14 U/L (ref 14–54)
AST: 24 U/L (ref 15–41)
Alkaline Phosphatase: 62 U/L (ref 38–126)
Anion gap: 9 (ref 5–15)
BUN: 6 mg/dL (ref 6–20)
CHLORIDE: 110 mmol/L (ref 101–111)
CO2: 23 mmol/L (ref 22–32)
Calcium: 9.2 mg/dL (ref 8.9–10.3)
Creatinine, Ser: 0.59 mg/dL (ref 0.44–1.00)
GFR calc Af Amer: >60 mL/min (ref >60)
GFR calc non Af Amer: >60 mL/min (ref >60)
GLUCOSE: 61 mg/dL — AB (ref 65–99)
POTASSIUM: 4.1 mmol/L (ref 3.5–5.1)
SODIUM: 142 mmol/L (ref 135–145)
Total Bilirubin: 0.8 mg/dL (ref 0.3–1.2)
Total Protein: 7.7 g/dL (ref 6.5–8.1)

## 2017-06-09 LAB — URINALYSIS, ROUTINE W REFLEX MICROSCOPIC
Bilirubin Urine: NEGATIVE
GLUCOSE, UA: NEGATIVE mg/dL
Hgb urine dipstick: NEGATIVE
Ketones, ur: NEGATIVE mg/dL
LEUKOCYTES UA: NEGATIVE
NITRITE: NEGATIVE
PROTEIN: NEGATIVE mg/dL
Specific Gravity, Urine: 1.002 — ABNORMAL LOW (ref 1.005–1.030)
pH: 6 (ref 5.0–8.0)

## 2017-06-09 LAB — CBC WITH DIFFERENTIAL/PLATELET
BASOS PCT: 0 %
Basophils Absolute: 0 10*3/uL (ref 0.0–0.1)
Eosinophils Absolute: 0.1 10*3/uL (ref 0.0–0.7)
Eosinophils Relative: 1 %
HEMATOCRIT: 38.6 % (ref 36.0–46.0)
Hemoglobin: 13.5 g/dL (ref 12.0–15.0)
LYMPHS ABS: 4.3 10*3/uL — AB (ref 0.7–4.0)
LYMPHS PCT: 43 %
MCH: 33.8 pg (ref 26.0–34.0)
MCHC: 35 g/dL (ref 30.0–36.0)
MCV: 96.7 fL (ref 78.0–100.0)
MONO ABS: 0.7 10*3/uL (ref 0.1–1.0)
MONOS PCT: 7 %
NEUTROS ABS: 5 10*3/uL (ref 1.7–7.7)
Neutrophils Relative %: 49 %
Platelets: 231 10*3/uL (ref 150–400)
RBC: 3.99 MIL/uL (ref 3.87–5.11)
RDW: 12.9 % (ref 11.5–15.5)
WBC: 10.1 10*3/uL (ref 4.0–10.5)

## 2017-06-09 LAB — I-STAT BETA HCG BLOOD, ED (MC, WL, AP ONLY): I-stat hCG, quantitative: 6.4 m[IU]/mL — ABNORMAL HIGH (ref <5)

## 2017-06-09 LAB — I-STAT CG4 LACTIC ACID, ED: Lactic Acid, Venous: 1.85 mmol/L (ref 0.5–1.9)

## 2017-06-09 MED ORDER — SODIUM CHLORIDE 0.9 % IV BOLUS (SEPSIS)
1000.0000 mL | Freq: Once | INTRAVENOUS | Status: AC
  Administered 2017-06-09: 1000 mL via INTRAVENOUS

## 2017-06-09 NOTE — ED Notes (Addendum)
Poison Control called. Patty at [employer] recommends: -Cardiac monitor at least 6 hours from arrival -Draw tylenol, liver functions, and aspirin level -Get EKG now and repeat in several hours -IV fluid -Benzos if needed  Watch for: -Conduction delays (QRS and QTC) -Seizures -Delirium -Urinary retention -Hypertension -Tachycardia

## 2017-06-09 NOTE — ED Triage Notes (Signed)
Patient brought in by friend Kandy Garrison [phone] states pt called after having an argument, when friend arrived patient was not responding and only staring. Unsure if patient took any substance.

## 2017-06-09 NOTE — ED Notes (Signed)
MD at bedside. 

## 2017-06-09 NOTE — ED Notes (Signed)
Sitter placed at bedside 

## 2017-06-09 NOTE — ED Provider Notes (Signed)
[facility]-EMERGENCY DEPT Provider Note   CSN: [id number] Arrival date & time: 06/09/17  2315     History   Chief Complaint No chief complaint on file.   HPI Courtney Hansen is a 41 y.o. female.  The history is provided by a friend. The history is limited by the condition of the patient (altered mental status).  She apparently had gotten into an argument tonight. Friends came by and our later and found her staring straight ahead and not responsive. She does have history of substance abuse and intentional drug overdose.  Past Medical History:  Diagnosis Date  . Depression     Patient Active Problem List   Diagnosis Date Noted  . Clonidine overdose 04/28/2017  . Alcohol use 04/28/2017  . Intentional drug overdose (HCC)   . Severe episode of recurrent major depressive disorder, without psychotic features (HCC)   . MDD (major depressive disorder) 12/20/2015    History reviewed. No pertinent surgical history.  OB History    No data available       Home Medications    Prior to Admission medications   Medication Sig Start Date End Date Taking? Authorizing Provider  citalopram (CELEXA) 20 MG tablet Take 1 tablet (20 mg total) by mouth daily. For depression Patient not taking: Reported on 11/30/2016 12/26/15   Armandina Stammer I, NP  sucralfate (CARAFATE) 1 GM/10ML suspension Take 10 mLs (1 g total) by mouth 4 (four) times daily -  with meals and at bedtime. Patient not taking: Reported on 04/28/2017 11/30/16   Palumbo, April, MD  traZODone (DESYREL) 100 MG tablet Take 1 tablet (100 mg total) by mouth at bedtime as needed for sleep. Patient not taking: Reported on 11/30/2016 12/26/15   Sanjuana Kava, NP    Family History Family History  Problem Relation Age of Onset  . Schizophrenia Brother   . Depression Brother     Social History Social History  Substance Use Topics  . Smoking status: Current Every Day Smoker    Packs/day: 0.50    Types: Cigarettes  . Smokeless tobacco:  Never Used  . Alcohol use Yes     Comment: daily      Allergies   Patient has no known allergies.   Review of Systems Review of Systems  Unable to perform ROS: Mental status change     Physical Exam Updated Vital Signs BP (!) 144/104 (BP Location: Left Arm)   Pulse (!) 124   Temp 98.1 F (36.7 C) (Oral)   Resp (!) 30   SpO2 100%   Physical Exam  Nursing note and vitals reviewed.  41 year old female, resting comfortably and in no acute distress. Vital signs are significant for tachycardia and hypertension and tachypnea. Oxygen saturation is 100%, which is normal. Her eyes are open and she will sometimes look at something, but most the eye movements are random. She does react to deep painful stimuli, but not with purposeful movement. Head is normocephalic and atraumatic. PERRLA, EOMI. She does not blink to threat. Oropharynx is clear. Gag reflex is intact. Neck is nontender and supple without adenopathy or JVD. Back is nontender and there is no CVA tenderness. Lungs are clear without rales, wheezes, or rhonchi. Chest is nontender. Heart is tachycardic without murmur. Abdomen is soft, flat, nontender without masses or hepatosplenomegaly and peristalsis is normoactive. Extremities have no cyanosis or edema, full range of motion is present. Skin is warm and dry without rash. Neurologic: Mental status is as noted above,  cranial nerves are grosslyintact. There are no gross motor or sensory deficits.  ED Treatments / Results  Labs (all labs ordered are listed, but only abnormal results are displayed) Labs Reviewed  COMPREHENSIVE METABOLIC PANEL - Abnormal; Notable for the following:       Result Value   Glucose, Bld 61 (*)    All other components within normal limits  ETHANOL - Abnormal; Notable for the following:    Alcohol, Ethyl (B) 178 (*)    All other components within normal limits  CBC WITH DIFFERENTIAL/PLATELET - Abnormal; Notable for the following:    Lymphs Abs  4.3 (*)    All other components within normal limits  ACETAMINOPHEN LEVEL - Abnormal; Notable for the following:    Acetaminophen (Tylenol), Serum <10 (*)    All other components within normal limits  URINALYSIS, ROUTINE W REFLEX MICROSCOPIC - Abnormal; Notable for the following:    Color, Urine COLORLESS (*)    Specific Gravity, Urine 1.002 (*)    All other components within normal limits  ACETAMINOPHEN LEVEL - Abnormal; Notable for the following:    Acetaminophen (Tylenol), Serum <10 (*)    All other components within normal limits  I-STAT BETA HCG BLOOD, ED ([facility], [facility], [facility] ONLY) - Abnormal; Notable for the following:    I-stat hCG, quantitative 6.4 (*)    All other components within normal limits  SALICYLATE LEVEL  RAPID URINE DRUG SCREEN, HOSP PERFORMED  MAGNESIUM  HCG, QUANTITATIVE, PREGNANCY  SALICYLATE LEVEL  I-STAT CG4 LACTIC ACID, ED  I-STAT BETA HCG BLOOD, ED ([facility], [facility], [facility] ONLY)    EKG  EKG Interpretation  Date/Time:  Tuesday June 09 2017 23:22:43 EDT Ventricular Rate:  116 PR Interval:    QRS Duration: 70 QT Interval:  363 QTC Calculation: 505 R Axis:   67 Text Interpretation:  Sinus tachycardia Minimal ST depression, inferior leads Borderline prolonged QT interval When compared with ECG of 04/29/2017, HEART RATE has increased QT has lengthened Confirmed by Dione Booze ([id number]) on 06/09/2017 11:25:49 PM       EKG Interpretation  Date/Time:  Wednesday June 10 2017 04:11:54 EDT Ventricular Rate:  97 PR Interval:    QRS Duration: 78 QT Interval:  399 QTC Calculation: 507 R Axis:   73 Text Interpretation:  Sinus rhythm Borderline prolonged QT interval When compared with ECG of 06/09/2017, HEART RATE has decreased Confirmed by Dione Booze ([id number]) on 06/10/2017 5:02:21 AM       Radiology Ct Head Wo Contrast  Result Date: 06/10/2017 CLINICAL DATA:  Possible Benadryl overdose.  Found unresponsive. EXAM: CT HEAD WITHOUT CONTRAST TECHNIQUE: Contiguous axial images  were obtained from the base of the skull through the vertex without intravenous contrast. COMPARISON:  11/21/2001 FINDINGS: Brain: No evidence of acute infarction, hemorrhage, hydrocephalus, extra-axial collection or mass lesion/mass effect. Vascular: No hyperdense vessel or unexpected calcification. Skull: Normal. Negative for fracture or focal lesion. Sinuses/Orbits: No acute finding. Other: None. IMPRESSION: No acute intracranial findings. Electronically Signed   By: Elberta Fortis M.D.   On: 06/10/2017 00:40    Procedures Procedures (including critical care time) CRITICAL CARE Performed by: UJWJX,BJYNW Total critical care time: 60 minutes Critical care time was exclusive of separately billable procedures and treating other patients. Critical care was necessary to treat or prevent imminent or life-threatening deterioration. Critical care was time spent personally by me on the following activities: development of treatment plan with patient and/or surrogate as well as nursing, discussions with consultants, evaluation of patient's response  to treatment, examination of patient, obtaining history from patient or surrogate, ordering and performing treatments and interventions, ordering and review of laboratory studies, ordering and review of radiographic studies, pulse oximetry and re-evaluation of patient's condition.  Medications Ordered in ED Medications  nicotine (NICODERM CQ - dosed in mg/24 hr) patch 7 mg (not administered)  alum & mag hydroxide-simeth (MAALOX/MYLANTA) 200-200-20 MG/5ML suspension 30 mL (not administered)  ondansetron (ZOFRAN) tablet 4 mg (not administered)  zolpidem (AMBIEN) tablet 5 mg (not administered)  acetaminophen (TYLENOL) tablet 650 mg (not administered)  sodium chloride 0.9 % bolus 1,000 mL (0 mLs Intravenous Stopped 06/10/17 0104)  0.9 %  sodium chloride infusion ( Intravenous New Bag/Given 06/10/17 0512)     Initial Impression / Assessment and Plan / ED Course    I have reviewed the triage vital signs and the nursing notes.  Pertinent labs & imaging results that were available during my care of the patient were reviewed by me and considered in my medical decision making (see chart for details).  Altered mental status. Review of old records does show recent hospitalization for clonidine overdose. Review of her medications shows she is taking citalopram, trazodone, and sucralfate. None of these is likely to cause her current symptoms. Concern is for other medications or illicit drugs she may have taken. She will be sent for CT of head, screening labs obtained including drug screen. She will be started on IV fluids.  11:51 PM A friend has called stating she took 30 diphenhydramine capsules. This would be a dose of 750 mg. This is unlikely to be fatal. She will need to be observed in the ED.  6:47 AM She has been observed in the ED for over 6 hours. On reexam, she is awake and alert and conversant. She is oriented, but does not remember events of last night. She is felt to be medically clear for psychiatric evaluation. She is transferred to psychiatric holding area for TTS consultation.  Final Clinical Impressions(s) / ED Diagnoses   Final diagnoses:  Intentional drug overdose, initial encounter [facility])    New Prescriptions New Prescriptions   No medications on file     Dione Booze, MD 06/10/17 [phone]

## 2017-06-09 NOTE — ED Notes (Signed)
Bed: WA14 Expected date:  Expected time:  Means of arrival:  Comments: 

## 2017-06-09 NOTE — ED Notes (Signed)
A female identified as patient's friend called ED and stated that patient took 30 benadryl tablets tonight. MD made aware.

## 2017-06-10 ENCOUNTER — Emergency Department (HOSPITAL_COMMUNITY): Payer: Self-pay

## 2017-06-10 DIAGNOSIS — Z560 Unemployment, unspecified: Secondary | ICD-10-CM

## 2017-06-10 DIAGNOSIS — Z630 Problems in relationship with spouse or partner: Secondary | ICD-10-CM

## 2017-06-10 DIAGNOSIS — R4587 Impulsiveness: Secondary | ICD-10-CM

## 2017-06-10 DIAGNOSIS — Z599 Problem related to housing and economic circumstances, unspecified: Secondary | ICD-10-CM

## 2017-06-10 DIAGNOSIS — F332 Major depressive disorder, recurrent severe without psychotic features: Secondary | ICD-10-CM

## 2017-06-10 DIAGNOSIS — F1020 Alcohol dependence, uncomplicated: Secondary | ICD-10-CM | POA: Diagnosis present

## 2017-06-10 DIAGNOSIS — T450X2A Poisoning by antiallergic and antiemetic drugs, intentional self-harm, initial encounter: Secondary | ICD-10-CM

## 2017-06-10 LAB — ACETAMINOPHEN LEVEL: Acetaminophen (Tylenol), Serum: <10 ug/mL — ABNORMAL LOW (ref 10–30)

## 2017-06-10 LAB — RAPID URINE DRUG SCREEN, HOSP PERFORMED
AMPHETAMINES: NOT DETECTED
Barbiturates: NOT DETECTED
Benzodiazepines: NOT DETECTED
Cocaine: NOT DETECTED
OPIATES: NOT DETECTED
Tetrahydrocannabinol: NOT DETECTED

## 2017-06-10 LAB — SALICYLATE LEVEL
Salicylate Lvl: <7 mg/dL (ref 2.8–30.0)
Salicylate Lvl: <7 mg/dL (ref 2.8–30.0)

## 2017-06-10 LAB — MAGNESIUM: Magnesium: 2.1 mg/dL (ref 1.7–2.4)

## 2017-06-10 LAB — ETHANOL: Alcohol, Ethyl (B): 178 mg/dL — ABNORMAL HIGH (ref <10)

## 2017-06-10 LAB — HCG, QUANTITATIVE, PREGNANCY

## 2017-06-10 MED ORDER — LORAZEPAM 1 MG PO TABS
0.0000 mg | ORAL_TABLET | Freq: Four times a day (QID) | ORAL | Status: DC
Start: 2017-06-10 — End: 2017-06-10

## 2017-06-10 MED ORDER — ALUM & MAG HYDROXIDE-SIMETH 200-200-20 MG/5ML PO SUSP: 30.0000 mL | Freq: Four times a day (QID) | ORAL | Status: DC | PRN

## 2017-06-10 MED ORDER — ZOLPIDEM TARTRATE 5 MG PO TABS: 5.0000 mg | ORAL_TABLET | Freq: Every evening | ORAL | Status: DC | PRN

## 2017-06-10 MED ORDER — LORAZEPAM 1 MG PO TABS
0.0000 mg | ORAL_TABLET | Freq: Two times a day (BID) | ORAL | Status: DC
Start: 2017-06-12 — End: 2017-06-10

## 2017-06-10 MED ORDER — GABAPENTIN 300 MG PO CAPS: 300.0000 mg | ORAL_CAPSULE | Freq: Two times a day (BID) | ORAL | Status: DC

## 2017-06-10 MED ORDER — FLUOXETINE HCL 20 MG PO CAPS: 20.0000 mg | ORAL_CAPSULE | Freq: Every day | ORAL | Status: DC

## 2017-06-10 MED ORDER — GABAPENTIN 100 MG PO CAPS
100.0000 mg | ORAL_CAPSULE | Freq: Two times a day (BID) | ORAL | Status: DC
  Filled 2017-06-10: qty 1

## 2017-06-10 MED ORDER — NICOTINE 7 MG/24HR TD PT24: 7.0000 mg | MEDICATED_PATCH | Freq: Every day | TRANSDERMAL | Status: DC

## 2017-06-10 MED ORDER — LORAZEPAM 2 MG/ML IJ SOLN
0.0000 mg | Freq: Two times a day (BID) | INTRAMUSCULAR | Status: DC
Start: 2017-06-12 — End: 2017-06-10

## 2017-06-10 MED ORDER — SODIUM CHLORIDE 0.9 % IV SOLN
Freq: Once | INTRAVENOUS | Status: AC
  Administered 2017-06-10: 05:00:00 via INTRAVENOUS

## 2017-06-10 MED ORDER — THIAMINE HCL 100 MG/ML IJ SOLN: 100.0000 mg | Freq: Every day | INTRAMUSCULAR | Status: DC

## 2017-06-10 MED ORDER — ACETAMINOPHEN 325 MG PO TABS: 650.0000 mg | ORAL_TABLET | ORAL | Status: DC | PRN

## 2017-06-10 MED ORDER — FLUOXETINE HCL 10 MG PO CAPS
10.0000 mg | ORAL_CAPSULE | Freq: Every day | ORAL | Status: DC
  Filled 2017-06-10: qty 1

## 2017-06-10 MED ORDER — VITAMIN B-1 100 MG PO TABS: 100.0000 mg | ORAL_TABLET | Freq: Every day | ORAL | Status: DC

## 2017-06-10 MED ORDER — ONDANSETRON HCL 4 MG PO TABS: 4.0000 mg | ORAL_TABLET | Freq: Three times a day (TID) | ORAL | Status: DC | PRN

## 2017-06-10 MED ORDER — LORAZEPAM 2 MG/ML IJ SOLN
0.0000 mg | Freq: Four times a day (QID) | INTRAMUSCULAR | Status: DC
Start: 2017-06-10 — End: 2017-06-10

## 2017-06-10 NOTE — ED Notes (Signed)
Pt admitted to room #39. Pt denies SI/HI/AVH. Pt reports "I took too many Benadryl." Pt reports she just wanted attention from her bf, denies this being a suicide attempt. Encouragement and support provided. Special checks q 15 mins in place for safety, Video monitoring in place. Will continue to monitor.

## 2017-06-10 NOTE — Discharge Instructions (Signed)
For your ongoing behavioral health needs you are advised to follow up with [facility] [place].  You have indicated that you have an appointment scheduled for Tuesday, June 16, 2017.  You are advised to keep this appointment:       [facility] [place]      [address] [place], [place] [address]      [phone]

## 2017-06-10 NOTE — ED Notes (Signed)
Bed: ZOX09 Expected date:  Expected time:  Means of arrival:  Comments: #14

## 2017-06-10 NOTE — ED Notes (Signed)
Patty from poison control [place] updated to pt labs and vital signs suggest  Infuse 1 to 2 liters 0.9nss for increased HR and benzo's for agitation as well as elevated hr

## 2017-06-10 NOTE — ED Notes (Signed)
Spoke with poison control. Reports pt is clear from poison control.

## 2017-06-10 NOTE — BH Assessment (Addendum)
[facility] Assessment Progress Note  Per Thedore Mins, MD, this pt does not require psychiatric hospitalization at this time.  Pt is to be discharged from [facility] with recommendation to follow up with [facility].  Pt indicates that she has an appointment scheduled for 06/16/2017, which she is advised to keep.  This has been included in pt's discharge instructions.  Pt would also benefit from seeing a Peer Support Specialist.  They will be asked to speak with this pt.  Pt's nurse, Morrie Sheldon, has been notified.  Doylene Canning, MA Triage Specialist [phone]
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
## 2017-06-10 NOTE — Progress Notes (Signed)
06/10/17 1348:  LRT went to pt room to offer activities, pt declined.   Javoni Lucken, LRT/CTRS  

## 2017-06-10 NOTE — ED Notes (Signed)
EKG completed and given to EDP for review. No new orders given. Will continue to monitor.

## 2017-06-10 NOTE — Patient Outreach (Signed)
ED Peer Support Specialist Patient Intake (Complete at intake & 30-60 Day Follow-up)  Name: Courtney Hansen  MRN: [id number]  Age: 41 y.o.   Date of Admission: 06/10/2017  Intake: Initial Comments:      Primary Reason Admitted:She took a hand full of pills. She admits that this was an attempt to end her life. Patient however feels she can discharge home if she is discharged. Sts, "I think I just did it for attention and maybe as a cry for help". She further reported that the suicide attempt was for attention. She lives with her boyfriend and is often stressed out and worried about her children.    Lab values: Alcohol/ETOH: Positive Positive UDS? Yes Amphetamines: No Barbiturates: No Benzodiazepines: No Cocaine: Yes Opiates:   Cannabinoids: Yes  Demographic information: Gender: Female Ethnicity: Other (comment) Marital Status: Single Insurance Status: Uninsured/Self-pay Control and instrumentation engineer (Work Engineer, agricultural, Sales executive, etc.: No Lives with: Partner/Spouse Living situation: House/Apartment  Reported Patient History: Patient reported health conditions: Depression, Other (comment) (PTSD) Patient aware of HIV and hepatitis status: No  In past year, has patient visited ED for any reason? Yes (Overdose)  Number of ED visits: 2  Reason(s) for visit: Overdose  In past year, has patient been hospitalized for any reason? Yes  Number of hospitalizations: 2  Reason(s) for hospitalization: for overdose  In past year, has patient been arrested? Yes  Number of arrests: 1  Reason(s) for arrest: assault  In past year, has patient been incarcerated? No  Number of incarcerations:    Reason(s) for incarceration:    In past year, has patient received medication-assisted treatment? No  In past year, patient received the following treatments: Other (comment)  In past year, has patient received any harm reduction services? No  Did this include any of the  following?    In past year, has patient received care from a mental health provider for diagnosis other than SUD? Yes  In past year, is this first time patient has overdosed? Yes  Number of past overdoses:    In past year, is this first time patient has been hospitalized for an overdose? Yes  Number of hospitalizations for overdose(s):    Is patient currently receiving treatment for a mental health diagnosis? Yes [facility]  Patient reports experiencing difficulty participating in SUD treatment: No    Most important reason(s) for this difficulty?    Has patient received prior services for treatment? 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 CPSS discussed the plan that she addressed she had and wanted to follow up with Pt in a few days to monitor services and work on looking into a outpatient treatment facility or group.    Arlys John Elen Acero, CPSS  06/10/2017 12:01 PM

## 2017-06-10 NOTE — ED Notes (Signed)
Pt d/c home per MD order. Discharge summary reviewed with pt. Pt verbalizes understanding. Pt denies SI/HI/AVH. Pt signed for personal property and property returned. Pt signed e-signature. Ambulatory off unit with MHT.  

## 2017-06-10 NOTE — ED Notes (Signed)
This nurse received call from [place] at poison control, recommending repeat EKG. This nurse notified EDP.

## 2017-06-10 NOTE — [facility] Suicide Risk Assessment (Signed)
Suicide Risk Assessment  Discharge Assessment   [facility] Discharge Suicide Risk Assessment   Principal Problem: Severe episode of recurrent major depressive disorder, without psychotic features [facility]) Discharge Diagnoses:  Patient Active Problem List   Diagnosis Date Noted  . Alcohol use disorder, severe, dependence (HCC) [F10.20] 06/10/2017  . Clonidine overdose [T46.5X1A] 04/28/2017  . Alcohol use [Z78.9] 04/28/2017  . Intentional drug overdose (HCC) [T50.902A]   . Severe episode of recurrent major depressive disorder, without psychotic features (HCC) [F33.2]   . MDD (major depressive disorder) [F32.9] 12/20/2015   Pt was seen and chart reviewed with treatment team and Dr Jannifer Franklin. Pt stated she took a handful of Benadryl in order to get attention from her boyfriend. Pt denies this was a suicide attempt. Pt's UDS negative, BAL 178. Pt is able to contract for safety upon discharge. Pt denies homicidal ideation and auditory and visual hallucinations.  Pt has an appointment with [facility] on Oct. 9, 2018 for therapy and medication management. Pt is psychiatrically clear for discharge.   Total Time spent with patient: 45 minutes  Musculoskeletal: Strength & Muscle Tone: within normal limits Gait & Station: normal Patient leans: N/A  Psychiatric Specialty Exam:   Blood pressure 129/85, pulse 81, temperature 97.9 F (36.6 C), temperature source Oral, resp. rate (!) 22, SpO2 99 %.There is no height or weight on file to calculate BMI.  General Appearance: Casual  Eye Contact::  Good  Speech:  Clear and Coherent and Normal Rate409  Volume:  Normal  Mood:  Depressed  Affect:  Congruent and Depressed  Thought Process:  Coherent, Goal Directed and Linear  Orientation:  Full (Time, Place, and Person)  Thought Content:  Logical  Suicidal Thoughts:  No  Homicidal Thoughts:  No  Memory:  Immediate;   Good Recent;   Good Remote;   Fair  Judgement:  Fair  Insight:  Fair   Psychomotor Activity:  Normal  Concentration:  Good  Recall:  Good  Fund of Knowledge:Good  Language: Good  Akathisia:  No  Handed:  Right  AIMS (if indicated):     Assets:  Communication Skills Desire for Improvement Financial Resources/Insurance Housing Physical Health Resilience Social Support  Sleep:     Cognition: WNL  ADL's:  Intact   Mental Status Per Nursing Assessment::   On Admission:   intoxicated  Demographic Factors:  Low socioeconomic status and Unemployed  Loss Factors: Financial problems/change in socioeconomic status  Historical Factors: Impulsivity  Risk Reduction Factors:   Sense of responsibility to family, Living with another person, especially a relative and Positive social support  Continued Clinical Symptoms:  Depression:   Impulsivity Alcohol/Substance Abuse/Dependencies More than one psychiatric diagnosis  Cognitive Features That Contribute To Risk:  Closed-mindedness    Suicide Risk:  Minimal: No identifiable suicidal ideation.  Patients presenting with no risk factors but with morbid ruminations; may be classified as minimal risk based on the severity of the depressive symptoms    Plan Of Care/Follow-up recommendations:  Activity:  as tolerated Diet:  Heart Healthy  Laveda Abbe, NP 06/10/2017, 10:23 AM

## 2017-06-10 NOTE — ED Notes (Signed)
Pt talking on hallway phone.  

## 2017-06-10 NOTE — ED Notes (Signed)
Pt flexing arm with activation of automated BP cuff causing erronous BP readings manual cuff ready obtained

## 2018-01-04 ENCOUNTER — Other Ambulatory Visit: Payer: Self-pay | Admitting: *Deleted

## 2018-01-04 DIAGNOSIS — Z1231 Encounter for screening mammogram for malignant neoplasm of breast: Secondary | ICD-10-CM

## 2018-06-07 ENCOUNTER — Emergency Department (HOSPITAL_COMMUNITY)
Admission: EM | Admit: 2018-06-07 | Discharge: 2018-06-07 | Disposition: A | Discharge status: Home / Self Care | Payer: Self-pay | Attending: Emergency Medicine | Admitting: Emergency Medicine

## 2018-06-07 ENCOUNTER — Other Ambulatory Visit: Payer: Self-pay

## 2018-06-07 ENCOUNTER — Encounter (HOSPITAL_COMMUNITY): Payer: Self-pay

## 2018-06-07 DIAGNOSIS — F4310 Post-traumatic stress disorder, unspecified: Secondary | ICD-10-CM | POA: Insufficient documentation

## 2018-06-07 DIAGNOSIS — Z5321 Procedure and treatment not carried out due to patient leaving prior to being seen by health care provider: Secondary | ICD-10-CM | POA: Insufficient documentation
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
## 2018-06-07 NOTE — ED Notes (Signed)
Bed: RESA Expected date:  Expected time:  Means of arrival:  Comments: 79s F SI

## 2018-06-07 NOTE — ED Notes (Signed)
Pt states that she does not want to be here. Pt agreed to see a provider.

## 2018-06-07 NOTE — ED Notes (Signed)
Pt states that she doesn't want to be here and is leaving. Continues to deny SI/HI. Ambulatory.

## 2018-06-07 NOTE — ED Notes (Signed)
PT also denies OD. She present with 3 pill bottles. All are still near full and are prescribed to her.

## 2018-08-02 ENCOUNTER — Encounter (HOSPITAL_COMMUNITY): Payer: Self-pay | Admitting: Emergency Medicine

## 2018-08-02 ENCOUNTER — Other Ambulatory Visit: Payer: Self-pay

## 2018-08-02 ENCOUNTER — Emergency Department (HOSPITAL_COMMUNITY)
Admission: EM | Admit: 2018-08-02 | Discharge: 2018-08-02 | Disposition: A | Discharge status: Other Acute Inpatient Hospital | Payer: Self-pay | Attending: Emergency Medicine | Admitting: Emergency Medicine

## 2018-08-02 DIAGNOSIS — F1020 Alcohol dependence, uncomplicated: Secondary | ICD-10-CM | POA: Insufficient documentation

## 2018-08-02 DIAGNOSIS — F332 Major depressive disorder, recurrent severe without psychotic features: Secondary | ICD-10-CM | POA: Insufficient documentation

## 2018-08-02 DIAGNOSIS — R45851 Suicidal ideations: Secondary | ICD-10-CM | POA: Insufficient documentation

## 2018-08-02 DIAGNOSIS — F17210 Nicotine dependence, cigarettes, uncomplicated: Secondary | ICD-10-CM | POA: Insufficient documentation

## 2018-08-02 DIAGNOSIS — Z008 Encounter for other general examination: Secondary | ICD-10-CM

## 2018-08-02 HISTORY — DX: Post-traumatic stress disorder, unspecified: F43.10

## 2018-08-02 LAB — RAPID URINE DRUG SCREEN, HOSP PERFORMED
Amphetamines: NOT DETECTED
Barbiturates: NOT DETECTED
Benzodiazepines: NOT DETECTED
Cocaine: NOT DETECTED
Opiates: NOT DETECTED
Tetrahydrocannabinol: POSITIVE — AB

## 2018-08-02 LAB — CBC WITH DIFFERENTIAL/PLATELET
Abs Immature Granulocytes: 0.02 10*3/uL (ref 0.00–0.07)
BASOS ABS: 0 10*3/uL (ref 0.0–0.1)
BASOS PCT: 0 %
EOS ABS: 0.1 10*3/uL (ref 0.0–0.5)
EOS PCT: 1 %
HCT: 40.5 % (ref 36.0–46.0)
Hemoglobin: 12.9 g/dL (ref 12.0–15.0)
Immature Granulocytes: 0 %
LYMPHS ABS: 2.5 10*3/uL (ref 0.7–4.0)
Lymphocytes Relative: 33 %
MCH: 31.9 pg (ref 26.0–34.0)
MCHC: 31.9 g/dL (ref 30.0–36.0)
MCV: 100 fL (ref 80.0–100.0)
MONOS PCT: 7 %
Monocytes Absolute: 0.6 10*3/uL (ref 0.1–1.0)
NRBC: 0 % (ref 0.0–0.2)
Neutro Abs: 4.4 10*3/uL (ref 1.7–7.7)
Neutrophils Relative %: 59 %
PLATELETS: 271 10*3/uL (ref 150–400)
RBC: 4.05 MIL/uL (ref 3.87–5.11)
RDW: 12.7 % (ref 11.5–15.5)
WBC: 7.6 10*3/uL (ref 4.0–10.5)

## 2018-08-02 LAB — COMPREHENSIVE METABOLIC PANEL
ALT: 11 U/L (ref 0–44)
AST: 19 U/L (ref 15–41)
Albumin: 4.5 g/dL (ref 3.5–5.0)
Alkaline Phosphatase: 56 U/L (ref 38–126)
Anion gap: 9 (ref 5–15)
BILIRUBIN TOTAL: 0.7 mg/dL (ref 0.3–1.2)
BUN: 6 mg/dL (ref 6–20)
CO2: 27 mmol/L (ref 22–32)
Calcium: 9.5 mg/dL (ref 8.9–10.3)
Chloride: 102 mmol/L (ref 98–111)
Creatinine, Ser: 0.68 mg/dL (ref 0.44–1.00)
Glucose, Bld: 145 mg/dL — ABNORMAL HIGH (ref 70–99)
Potassium: 4.1 mmol/L (ref 3.5–5.1)
Sodium: 138 mmol/L (ref 135–145)
TOTAL PROTEIN: 7.1 g/dL (ref 6.5–8.1)

## 2018-08-02 LAB — SALICYLATE LEVEL

## 2018-08-02 LAB — I-STAT BETA HCG BLOOD, ED (MC, WL, AP ONLY)

## 2018-08-02 LAB — ETHANOL

## 2018-08-02 LAB — ACETAMINOPHEN LEVEL: Acetaminophen (Tylenol), Serum: <10 ug/mL — ABNORMAL LOW (ref 10–30)

## 2018-08-02 NOTE — ED Provider Notes (Signed)
[facility] EMERGENCY DEPARTMENT Provider Note   CSN: [id number] Arrival date & time: 08/02/18  1909     History   Chief Complaint Chief Complaint  Patient presents with  . Suicidal    HPI Courtney Hansen is a 42 y.o. female with a history of depression and PTSD who presents emergency department today voluntarily for suicidal ideation.  Patient reports that over the last 1 months she has been dealing with increasing depression and thoughts of harming herself.  She reports that she has been having intermittent thoughts to harm herself and her therapist recommended she check into the emergency department for evaluation.  She reports she has had several attempts to harm herself in the past and believes she would either overdose, jump of a bridge, or hang herself.  Patient is voluntarily seeking help for this.  She is here with her mother who is supportive.  Patient denies any physical complaints at this time.  She notes she used to have a problem with alcohol but denies any recently.  No alcoholic beverages in the last 1 week.  The patient notes occasional marijuana use but no other illicit drug use.  No attempts of self harm PTA.   HPI  Past Medical History:  Diagnosis Date  . Depression   . PTSD (post-traumatic stress disorder)     Patient Active Problem List   Diagnosis Date Noted  . Alcohol use disorder, severe, dependence (HCC) 06/10/2017  . Clonidine overdose 04/28/2017  . Alcohol use 04/28/2017  . Intentional drug overdose (HCC)   . Severe episode of recurrent major depressive disorder, without psychotic features (HCC)   . MDD (major depressive disorder) 12/20/2015    History reviewed. No pertinent surgical history.   OB History   None      Home Medications    Prior to Admission medications   Not on File    Family History Family History  Problem Relation Age of Onset  . Schizophrenia Brother   . Depression Brother     Social  History Social History   Tobacco Use  . Smoking status: Current Every Day Smoker    Packs/day: 0.50    Types: Cigarettes  . Smokeless tobacco: Never Used  Substance Use Topics  . Alcohol use: Yes    Comment: daily   . Drug use: Yes    Types: Marijuana     Allergies   Patient has no known allergies.   Review of Systems Review of Systems  All other systems reviewed and are negative.    Physical Exam Updated Vital Signs BP 129/72   Pulse 63   Temp 99.3 F (37.4 C) (Oral)   Resp 18   Ht 4\' 10"  (1.473 m)   Wt 40.8 kg   LMP 07/19/2018   SpO2 99%   BMI 18.81 kg/m   Physical Exam  Constitutional: She appears well-developed and well-nourished.  HENT:  Head: Normocephalic and atraumatic.  Right Ear: External ear normal.  Left Ear: External ear normal.  Nose: Nose normal.  Mouth/Throat: Uvula is midline, oropharynx is clear and moist and mucous membranes are normal. No tonsillar exudate.  Eyes: Pupils are equal, round, and reactive to light. Right eye exhibits no discharge. Left eye exhibits no discharge. No scleral icterus.  Neck: Trachea normal. Neck supple. No spinous process tenderness present. No neck rigidity. Normal range of motion present.  Cardiovascular: Normal rate, regular rhythm and intact distal pulses.  No murmur heard. Pulses:  Radial pulses are 2+ on the right side, and 2+ on the left side.       Dorsalis pedis pulses are 2+ on the right side, and 2+ on the left side.       Posterior tibial pulses are 2+ on the right side, and 2+ on the left side.  Pulmonary/Chest: Effort normal and breath sounds normal. She exhibits no tenderness.  Abdominal: Soft. Bowel sounds are normal. There is no tenderness. There is no rigidity, no rebound and no guarding.  Musculoskeletal: She exhibits no edema.  Lymphadenopathy:    She has no cervical adenopathy.  Neurological: She is alert.  Skin: Skin is warm and dry. No rash noted. She is not diaphoretic.   Psychiatric: She has a normal mood and affect.  Nursing note and vitals reviewed.    ED Treatments / Results  Labs (all labs ordered are listed, but only abnormal results are displayed) Labs Reviewed  ACETAMINOPHEN LEVEL - Abnormal; Notable for the following components:      Result Value   Acetaminophen (Tylenol), Serum <10 (*)    All other components within normal limits  COMPREHENSIVE METABOLIC PANEL - Abnormal; Notable for the following components:   Glucose, Bld 145 (*)    All other components within normal limits  RAPID URINE DRUG SCREEN, HOSP PERFORMED - Abnormal; Notable for the following components:   Tetrahydrocannabinol POSITIVE (*)    All other components within normal limits  ETHANOL  SALICYLATE LEVEL  CBC WITH DIFFERENTIAL/PLATELET  I-STAT BETA HCG BLOOD, ED ([facility], [facility], [facility] ONLY)    EKG None  Radiology No results found.  Procedures Procedures (including critical care time)  Medications Ordered in ED Medications - No data to display   Initial Impression / Assessment and Plan / ED Course  I have reviewed the triage vital signs and the nursing notes.  Pertinent labs & imaging results that were available during my care of the patient were reviewed by me and considered in my medical decision making (see chart for details).     42 y.o. female with history of depression and PTSD presenting for suicidal ideation.  Patient does have a plan.  She denies any homicidal ideation.  She denies any visual or auditory hallucinations.  Patient vital signs are reassuring on presentation.  She denies any physical complaints at this time.  Exam is reassuring as above.  Patient's labs reviewed and reassuring.  Patient is medically cleared.  TTS recommends inpatient treatment.  No home medications reordered.  Final Clinical Impressions(s) / ED Diagnoses   Final diagnoses:  Suicidal ideation  Medical clearance for psychiatric admission    ED Discharge Orders    None        Princella PellegriniMaczis, Estefanie Cornforth M, PA-C 08/02/18 2351    Maia PlanLong, Joshua G, MD 08/03/18 [phone]

## 2018-08-02 NOTE — BH Assessment (Signed)
[phone]

## 2018-08-02 NOTE — ED Triage Notes (Signed)
Pt states she is feeling depressed and suicidal. Denies concrete plans. Requesting help getting meds managed.
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
## 2018-08-02 NOTE — ED Notes (Signed)
Patient signed voluntary consent form; [facility]Monique,[facility]

## 2018-08-02 NOTE — ED Notes (Signed)
Patient belongings has been inventoried and patient has verified [facility]

## 2018-08-02 NOTE — ED Notes (Signed)
Pt to be wanded by security. Mother of patient informed of visitation hours. Belongings to be sent home with mom. Other clothing to be inventoried by EMT.

## 2018-08-03 ENCOUNTER — Encounter (HOSPITAL_COMMUNITY): Payer: Self-pay | Admitting: *Deleted

## 2018-08-03 ENCOUNTER — Inpatient Hospital Stay (HOSPITAL_COMMUNITY)
Admission: RE | Admit: 2018-08-03 | Discharge: 2018-08-06 | DRG: 885 | Disposition: A | Discharge status: Home / Self Care | Payer: Federal, State, Local not specified - Other | Attending: Psychiatry | Admitting: Psychiatry

## 2018-08-03 ENCOUNTER — Other Ambulatory Visit: Payer: Self-pay

## 2018-08-03 DIAGNOSIS — R634 Abnormal weight loss: Secondary | ICD-10-CM | POA: Diagnosis present

## 2018-08-03 DIAGNOSIS — F17210 Nicotine dependence, cigarettes, uncomplicated: Secondary | ICD-10-CM | POA: Diagnosis present

## 2018-08-03 DIAGNOSIS — Z681 Body mass index (BMI) 19 or less, adult: Secondary | ICD-10-CM | POA: Diagnosis not present

## 2018-08-03 DIAGNOSIS — R45851 Suicidal ideations: Secondary | ICD-10-CM | POA: Diagnosis present

## 2018-08-03 DIAGNOSIS — F332 Major depressive disorder, recurrent severe without psychotic features: Secondary | ICD-10-CM | POA: Diagnosis present

## 2018-08-03 DIAGNOSIS — F4310 Post-traumatic stress disorder, unspecified: Secondary | ICD-10-CM | POA: Diagnosis present

## 2018-08-03 DIAGNOSIS — F419 Anxiety disorder, unspecified: Secondary | ICD-10-CM

## 2018-08-03 DIAGNOSIS — G4700 Insomnia, unspecified: Secondary | ICD-10-CM | POA: Diagnosis present

## 2018-08-03 DIAGNOSIS — Z818 Family history of other mental and behavioral disorders: Secondary | ICD-10-CM | POA: Diagnosis not present

## 2018-08-03 MED ORDER — ACETAMINOPHEN 325 MG PO TABS: 650.0000 mg | ORAL_TABLET | Freq: Four times a day (QID) | ORAL | Status: DC | PRN

## 2018-08-03 MED ORDER — MIRTAZAPINE 7.5 MG PO TABS
7.5000 mg | ORAL_TABLET | Freq: Every day | ORAL | Status: DC
  Administered 2018-08-03 – 2018-08-04 (×2): 7.5 mg via ORAL
  Filled 2018-08-03 (×3): qty 1

## 2018-08-03 MED ORDER — LORAZEPAM 0.5 MG PO TABS
0.5000 mg | ORAL_TABLET | Freq: Four times a day (QID) | ORAL | Status: DC | PRN
  Administered 2018-08-03 – 2018-08-04 (×2): 0.5 mg via ORAL
  Filled 2018-08-03 (×2): qty 1

## 2018-08-03 MED ORDER — HYDROXYZINE HCL 25 MG PO TABS
25.0000 mg | ORAL_TABLET | Freq: Four times a day (QID) | ORAL | Status: DC | PRN
  Administered 2018-08-03: 25 mg via ORAL
  Filled 2018-08-03: qty 1

## 2018-08-03 MED ORDER — ALUM & MAG HYDROXIDE-SIMETH 200-200-20 MG/5ML PO SUSP
30.0000 mL | ORAL | Status: DC | PRN
  Administered 2018-08-04: 30 mL via ORAL
  Filled 2018-08-03: qty 30

## 2018-08-03 MED ORDER — MAGNESIUM HYDROXIDE 400 MG/5ML PO SUSP: 30.0000 mL | Freq: Every day | ORAL | Status: DC | PRN

## 2018-08-03 MED ORDER — TRAZODONE HCL 50 MG PO TABS
50.0000 mg | ORAL_TABLET | Freq: Every evening | ORAL | Status: DC | PRN
  Administered 2018-08-03: 50 mg via ORAL
  Filled 2018-08-03 (×2): qty 1

## 2018-08-03 MED ORDER — GABAPENTIN 100 MG PO CAPS
100.0000 mg | ORAL_CAPSULE | Freq: Three times a day (TID) | ORAL | Status: DC
  Administered 2018-08-03 – 2018-08-04 (×3): 100 mg via ORAL
  Filled 2018-08-03 (×6): qty 1

## 2018-08-03 NOTE — [facility] Suicide Risk Assessment (Signed)
[facility] INPATIENT:  Family/Significant Other Suicide Prevention Education  Suicide Prevention Education:  Patient Refusal for Family/Significant Other Suicide Prevention Education: The patient Courtney Hansen has refused to provide written consent for family/significant other to be provided Family/Significant Other Suicide Prevention Education during admission and/or prior to discharge.  Physician notified.  SPE completed with patient, as patient refused to consent to family contact. Patient was encouraged to share information with support network, ask questions, and talk about any concerns relating to SPE. Patient denies access to guns/firearms and verbalized understanding of information provided. Mobile Crisis information also provided to patient.    Maeola SarahJolan E Moniqua Engebretsen 08/03/2018, 11:12 AM

## 2018-08-03 NOTE — [facility] Group Notes (Signed)
Nursing Adult Psychoeducational Group Note  Date:  08/03/2018 Time:  4:00 PM  Group Topic/Focus: Self-Care Self Care:   The focus of this group is to help patients understand the importance of self-care in order to improve or restore emotional, physical, spiritual, interpersonal, and financial health.  Participation Level:  Did Not Attend  Additional Comments:  Patient was invited but did not attend group.  Karrina Lye A Sherolyn Trettin 08/03/2018, 4:45 PM 
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
## 2018-08-03 NOTE — BHH Group Notes (Signed)
LCSW Group Therapy Note 08/03/2018 12:24 PM  Type of Therapy and Topic: Group Therapy: Overcoming Obstacles  Participation Level: Did Not Attend  Description of Group:  In this group patients will be encouraged to explore what they see as obstacles to their own wellness and recovery. They will be guided to discuss their thoughts, feelings, and behaviors related to these obstacles. The group will process together ways to cope with barriers, with attention given to specific choices patients can make. Each patient will be challenged to identify changes they are motivated to make in order to overcome their obstacles. This group will be process-oriented, with patients participating in exploration of their own experiences as well as giving and receiving support and challenge from other group members.  Therapeutic Goals: 1. Patient will identify personal and current obstacles as they relate to admission. 2. Patient will identify barriers that currently interfere with their wellness or overcoming obstacles.  3. Patient will identify feelings, thought process and behaviors related to these barriers. 4. Patient will identify two changes they are willing to make to overcome these obstacles:   Summary of Patient Progress  Invited, chose not to attend.    Therapeutic Modalities:  Cognitive Behavioral Therapy Solution Focused Therapy Motivational Interviewing Relapse Prevention Therapy   Alcario DroughtJolan Britanee Vanblarcom LCSWA Clinical Social Worker
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
## 2018-08-03 NOTE — Tx Team (Signed)
Interdisciplinary Treatment and Diagnostic Plan Update  08/03/2018 Time of Session:  Courtney Hansen MRN: [id number]  Principal Diagnosis: <principal problem not specified>  Secondary Diagnoses: Active Problems:   MDD (major depressive disorder), recurrent episode, severe (HCC)   Current Medications:  Current Facility-Administered Medications  Medication Dose Route Frequency Provider Last Rate Last Dose  . acetaminophen (TYLENOL) tablet 650 mg  650 mg Oral Q6H PRN Laverle Hobby, PA-C      . alum & mag hydroxide-simeth (MAALOX/MYLANTA) 200-200-20 MG/5ML suspension 30 mL  30 mL Oral Q4H PRN Patriciaann Clan E, PA-C      . gabapentin (NEURONTIN) capsule 100 mg  100 mg Oral TID Cobos, Myer Peer, MD   100 mg at 08/03/18 1300  . LORazepam (ATIVAN) tablet 0.5 mg  0.5 mg Oral Q6H PRN Cobos, Myer Peer, MD      . magnesium hydroxide (MILK OF MAGNESIA) suspension 30 mL  30 mL Oral Daily PRN Patriciaann Clan E, PA-C      . mirtazapine (REMERON) tablet 7.5 mg  7.5 mg Oral QHS Cobos, Myer Peer, MD       PTA Medications: Medications Prior to Admission  Medication Sig Dispense Refill Last Dose  . gabapentin (NEURONTIN) 100 MG capsule Take 100 mg by mouth 3 (three) times daily as needed (May also take for sleep).   over a week ago    Patient Stressors: Arts development officer issue Marital or family conflict Medication change or noncompliance  Patient Strengths: Average or above average intelligence Capable of independent living General fund of knowledge Motivation for treatment/growth Physical Health  Treatment Modalities: Medication Management, Group therapy, Case management,  1 to 1 session with clinician, Psychoeducation, Recreational therapy.   Physician Treatment Plan for Primary Diagnosis: <principal problem not specified> Long Term Goal(s):     Short Term Goals:    Medication Management: Evaluate patient's response, side effects, and tolerance of medication  regimen.  Therapeutic Interventions: 1 to 1 sessions, Unit Group sessions and Medication administration.  Evaluation of Outcomes: Not Met  Physician Treatment Plan for Secondary Diagnosis: Active Problems:   MDD (major depressive disorder), recurrent episode, severe ([facility])  Long Term Goal(s):     Short Term Goals:       Medication Management: Evaluate patient's response, side effects, and tolerance of medication regimen.  Therapeutic Interventions: 1 to 1 sessions, Unit Group sessions and Medication administration.  Evaluation of Outcomes: Not Met   RN Treatment Plan for Primary Diagnosis: <principal problem not specified> Long Term Goal(s): Knowledge of disease and therapeutic regimen to maintain health will improve  Short Term Goals: Ability to participate in decision making will improve, Ability to verbalize feelings will improve, Ability to disclose and discuss suicidal ideas and Ability to identify and develop effective coping behaviors will improve  Medication Management: RN will administer medications as ordered by provider, will assess and evaluate patient's response and provide education to patient for prescribed medication. RN will report any adverse and/or side effects to prescribing provider.  Therapeutic Interventions: 1 on 1 counseling sessions, Psychoeducation, Medication administration, Evaluate responses to treatment, Monitor vital signs and CBGs as ordered, Perform/monitor CIWA, COWS, AIMS and Fall Risk screenings as ordered, Perform wound care treatments as ordered.  Evaluation of Outcomes: Not Met   LCSW Treatment Plan for Primary Diagnosis: <principal problem not specified> Long Term Goal(s): Safe transition to appropriate next level of care at discharge, Engage patient in therapeutic group addressing interpersonal concerns.  Short Term Goals: Engage patient in aftercare  planning with referrals and resources  Therapeutic Interventions: Assess for all discharge  needs, 1 to 1 time with Social worker, Explore available resources and support systems, Assess for adequacy in community support network, Educate family and significant other(s) on suicide prevention, Complete Psychosocial Assessment, Interpersonal group therapy.  Evaluation of Outcomes: Not Met   Progress in Treatment: Attending groups: No. Participating in groups: No. Taking medication as prescribed: Yes. Toleration medication: Yes. Family/Significant other contact made: No, will contact:  patient declines consent for collateral contacts  Patient understands diagnosis: Yes. Discussing patient identified problems/goals with staff: Yes. Medical problems stabilized or resolved: Yes. Denies suicidal/homicidal ideation: Yes. Issues/concerns per patient self-inventory: No. Other:   New problem(s) identified: None   New Short Term/Long Term Goal(s): medication stabilization, elimination of SI thoughts, development of comprehensive mental wellness plan.    Patient Goals: Get back on anti-depressants  Discharge Plan or Barriers: Patient plans to discharge home with her mother or with friends. She plans to continue to [facility] [place] for outpatient medication management and therapy services. Patient reports that she also has a primary care provider through the same agency.   Reason for Continuation of Hospitalization: Anxiety Depression Medication stabilization Suicidal ideation  Estimated Length of Stay: 3-5 days   Attendees: Patient: 08/03/2018 2:45 PM  Physician: Dr. Neita Garnet, MD 08/03/2018 2:45 PM  Nursing: Chrys Racer.Jacinto Reap, RN 08/03/2018 2:45 PM  RN Care Manager: 08/03/2018 2:45 PM  Social Worker: Radonna Ricker, [place] 08/03/2018 2:45 PM  Recreational Therapist:  08/03/2018 2:45 PM  Other:  08/03/2018 2:45 PM  Other:  08/03/2018 2:45 PM  Other: 08/03/2018 2:45 PM    Scribe for Treatment Team: Marylee Floras, [place] 08/03/2018 2:45 PM
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
## 2018-08-03 NOTE — Progress Notes (Signed)
Patient ID: Courtney Hansen, female   DOB: Apr 06, 1976, 42 y.o.   MRN: [id number]  Nursing Progress Note [id number]  Data: Patient presents with bright affect and is pleasant/cooperative during interactions. Patient compliant with scheduled medications. Patient was provided a handout and verbal education about her medications per patient request. Patient verbalized understanding. Patient denies current pain/physical complaints. Patient is seen resting in her room for most of the day and has been encouraged to attend groups. Patient expressed interested in Pet Therapy. Patient currently denies SI/HI/AVH. EKG performed and is WNL.  Action: Patient is educated about and provided medication per provider's orders. Patient safety maintained with q15 min safety checks and frequent rounding. Low fall risk precautions in place. Emotional support given. 1:1 interaction and active listening provided. Patient encouraged to attend meals, groups, and work on treatment plan and goals. Labs, vital signs and patient behavior monitored throughout shift.   Response: Patient remains safe on the unit at this time and agrees to come to staff with any issues/concerns. Patient is interacting with peers appropriately on the unit. Will continue to support and monitor.

## 2018-08-03 NOTE — Progress Notes (Signed)
D:  Courtney Hansen was in the day room much of the evening.  She was noted reading or coloring off by herself.  She was noted interacting with peers.  She did attend evening wrap up group.  She was pleasant and cooperative.  She took her hs medications well.  She did come back later and asked for lorazepam for anxiety.  She denied SI/HI or A/V hallucinations.   A:  1:1 with RN for support and encouragement.  Medications as ordered.  Q 15 minute checks maintained for safety.  Encouraged participation in group and unit activities.   R:  Courtney Hansen remains safe on the unit.  We will continue to monitor the progress towards her goals.

## 2018-08-03 NOTE — Plan of Care (Signed)
  Problem: Coping: Goal: Ability to verbalize frustrations and anger appropriately will improve Outcome: Progressing   Problem: Health Behavior/Discharge Planning: Goal: Identification of resources available to assist in meeting health care needs will improve Outcome: Progressing Goal: Compliance with treatment plan for underlying cause of condition will improve Outcome: Progressing   Problem: Physical Regulation: Goal: Ability to maintain clinical measurements within normal limits will improve Outcome: Progressing   Problem: Safety: Goal: Periods of time without injury will increase Outcome: Progressing
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
## 2018-08-04 LAB — TSH: TSH: 1.024 u[IU]/mL (ref 0.350–4.500)

## 2018-08-04 MED ORDER — GABAPENTIN 300 MG PO CAPS
300.0000 mg | ORAL_CAPSULE | Freq: Three times a day (TID) | ORAL | Status: DC
  Administered 2018-08-04 – 2018-08-06 (×6): 300 mg via ORAL
  Filled 2018-08-04 (×9): qty 1

## 2018-08-04 MED ORDER — ENSURE ENLIVE PO LIQD
237.0000 mL | Freq: Two times a day (BID) | ORAL | Status: DC
  Administered 2018-08-04 – 2018-08-05 (×4): 237 mL via ORAL

## 2018-08-04 NOTE — Progress Notes (Signed)
D: Pt was in the hallway upon initial approach.  Pt presents with anxious affect and mood.  She reports she had a good visit with her friend tonight.  Pt reports her goal today was to "try to eat and I ate very well today and drank two ensures."  Pt denies SI/HI, denies hallucinations, denies pain.  Pt has been visible in milieu interacting with peers and staff appropriately.    A: Introduced self to pt.  Met with pt 1:1.  Actively listened to pt and offered support and encouragement.  Medication administered per order.  PRN medication administered for anxiety and indigestion.  Fall prevention techniques reviewed with pt and she verbalized understanding.  Q15 minute safety checks maintained.  R: Pt is safe on the unit.  Pt is compliant with medications.  Pt verbally contracts for safety.  Will continue to monitor and assess.

## 2018-08-04 NOTE — Progress Notes (Signed)
Recreation Therapy Notes  Date: 11.27.19 Time: 930 Location: 300 Hall Dayroom  Group Topic: Stress Management  Goal Area(s) Addresses:  Patient will verbalize importance of using healthy stress management.  Patient will identify positive emotions associated with healthy stress management.   Intervention: Stress Management  Activity :  Meditation.  LRT introduced the stress management technique of meditation.  LRT played a meditation that guided patients to focus on the present and let go of things they can't change.  Patients were to follow along as the meditation played.  Education:  Stress Management, Discharge Planning.   Education Outcome: Acknowledges edcuation/In group clarification offered/Needs additional education  Clinical Observations/Feedback: Pt did not attend group.     Gaylen Venning, LRT/CTRS         Derk Doubek A 08/04/2018 11:04 AM 

## 2018-08-04 NOTE — [facility] Group Notes (Signed)
[facility]      08/04/2018 11:16 AM  Type of Therapy: Mental Health Association Presentation  Participation Level: Active  Participation Quality: Attentive  Affect: Appropriate  Cognitive: Oriented  Insight: Developing/Improving  Engagement in Therapy: Engaged  Modes of Intervention: Discussion, Education and Socialization  Summary of Progress/Problems: Mental Health Association (MHA) Speaker came to talk about his personal journey with mental health. The pt processed ways by which to relate to the speaker. MHA speaker provided handouts and educational information pertaining to groups and services offered by the [facility]. Pt was engaged in speaker's presentation and was receptive to resources provided.    Alcario DroughtJolan Zaryah Seckel LCSWA Clinical Social Worker

## 2018-08-04 NOTE — [facility] Group Notes (Signed)
The focus of this group is to help patients establish daily goals to achieve during treatment and discuss how the patient can incorporate goal setting into their daily lives to aide in recovery.  Pt. Did not attend 

## 2018-08-04 NOTE — Progress Notes (Signed)
[facility] MD Progress Note  08/04/2018 10:59 AM Courtney Hansen  MRN:  [id number] Subjective: Patient describes partial improvement compared to how she felt prior to admission.  States she feels that being in inpatient setting is helping her feel less anxious and calmer. At this time denies medication side effects. Denies suicidal ideations. Objective: I have discussed case with treatment team and have met with patient. 42 year old single female, had been living with boyfriend recently, describes worsening depression, suicidal thoughts, with thoughts of hanging herself for the last several days.  Attributes partially to strained relationship with boyfriend she reports kicked her out of the house when they argued.  History of depression, PTSD and a prior psychiatric admission for depression in 2017.  Prior history of alcohol abuse but not recently.  Today patient presents with partially improved mood and a more reactive affect.  Continues to describe depression but acknowledges feeling partially better than she did prior to admission. She denies suicidal ideations at this time. Currently tolerating medications well, denies side effects. Visible in dayroom, no agitated or disruptive behaviors. Principal Problem:  MDD Diagnosis: Active Problems:   MDD (major depressive disorder), recurrent episode, severe ([place])  Total Time spent with patient: 20 minutes  Past Psychiatric History:   Past Medical History:  Past Medical History:  Diagnosis Date  . Depression   . PTSD (post-traumatic stress disorder)    History reviewed. No pertinent surgical history. Family History:  Family History  Problem Relation Age of Onset  . Schizophrenia Brother   . Depression Brother    Family Psychiatric  History:  Social History:  Social History   Substance and Sexual Activity  Alcohol Use Yes   Comment: occasional drinker     Social History   Substance and Sexual Activity  Drug Use Yes  . Types: Marijuana     Social History   Socioeconomic History  . Marital status: Divorced    Spouse name: Not on file  . Number of children: Not on file  . Years of education: Not on file  . Highest education level: Not on file  Occupational History  . Not on file  Social Needs  . Financial resource strain: Not on file  . Food insecurity:    Worry: Not on file    Inability: Not on file  . Transportation needs:    Medical: Not on file    Non-medical: Not on file  Tobacco Use  . Smoking status: Current Every Day Smoker    Packs/day: 0.50    Types: Cigarettes  . Smokeless tobacco: Never Used  . Tobacco comment: Pt declined counsel  Substance and Sexual Activity  . Alcohol use: Yes    Comment: occasional drinker  . Drug use: Yes    Types: Marijuana  . Sexual activity: Yes    Birth control/protection: None  Lifestyle  . Physical activity:    Days per week: Not on file    Minutes per session: Not on file  . Stress: Not on file  Relationships  . Social connections:    Talks on phone: Not on file    Gets together: Not on file    Attends religious service: Not on file    Active member of club or organization: Not on file    Attends meetings of clubs or organizations: Not on file    Relationship status: Not on file  Other Topics Concern  . Not on file  Social History Narrative  . Not on file  Additional Social History:    Pain Medications: See home med list Prescriptions: See home med list Over the Counter: See home med list History of alcohol / drug use?: Yes Negative Consequences of Use: Personal relationships Name of Substance 1: Alcohol 1 - Age of First Use: teens 1 - Amount (size/oz): 1-2 beers 1 - Frequency: less than monthly 1 - Duration: ongoing 1 - Last Use / Amount: "last week" Name of Substance 2: marijuana 2 - Age of First Use: 20's 2 - Amount (size/oz): varies 2 - Frequency: weekly 2 - Duration: ongoing 2 - Last Use / Amount: "couple of days ago"  Sleep:  Fair  Appetite:  Improving  Current Medications: Current Facility-Administered Medications  Medication Dose Route Frequency Provider Last Rate Last Dose  . acetaminophen (TYLENOL) tablet 650 mg  650 mg Oral Q6H PRN Laverle Hobby, PA-C      . alum & mag hydroxide-simeth (MAALOX/MYLANTA) 200-200-20 MG/5ML suspension 30 mL  30 mL Oral Q4H PRN Patriciaann Clan E, PA-C      . gabapentin (NEURONTIN) capsule 300 mg  300 mg Oral TID Cobos, Myer Peer, MD      . LORazepam (ATIVAN) tablet 0.5 mg  0.5 mg Oral Q6H PRN Cobos, Myer Peer, MD   0.5 mg at 08/03/18 2253  . magnesium hydroxide (MILK OF MAGNESIA) suspension 30 mL  30 mL Oral Daily PRN Laverle Hobby, PA-C      . mirtazapine (REMERON) tablet 7.5 mg  7.5 mg Oral QHS Cobos, Myer Peer, MD   7.5 mg at 08/03/18 2152    Lab Results:  Results for orders placed or performed during the hospital encounter of 08/03/18 (from the past 48 hour(s))  TSH     Status: None   Collection Time: 08/04/18  6:41 AM  Result Value Ref Range   TSH 1.024 0.350 - 4.500 uIU/mL    Comment: Performed by a 3rd Generation assay with a functional sensitivity of <=0.01 uIU/mL. Performed at [facility], [place] [address], [place], [place] [address]     Blood Alcohol level:  Lab Results  Component Value Date   ETH <10 08/02/2018   ETH 178 (H) 82/95/6213    Metabolic Disorder Labs: No results found for: HGBA1C, MPG No results found for: PROLACTIN No results found for: CHOL, TRIG, HDL, CHOLHDL, VLDL, LDLCALC  Physical Findings: AIMS: Facial and Oral Movements Muscles of Facial Expression: None, normal Lips and Perioral Area: None, normal Jaw: None, normal Tongue: None, normal,Extremity Movements Upper (arms, wrists, hands, fingers): None, normal Lower (legs, knees, ankles, toes): None, normal, Trunk Movements Neck, shoulders, hips: None, normal, Overall Severity Severity of abnormal movements (highest score from questions above): None,  normal Incapacitation due to abnormal movements: None, normal Patient's awareness of abnormal movements (rate only patient's report): No Awareness, Dental Status Current problems with teeth and/or dentures?: No Does patient usually wear dentures?: No  CIWA:    COWS:     Musculoskeletal: Strength & Muscle Tone: within normal limits Gait & Station: normal Patient leans: N/A  Psychiatric Specialty Exam: Physical Exam  ROS no chest pain, no shortness of breath, no vomiting  Blood pressure 106/75, pulse 62, temperature 98 F (36.7 C), temperature source Oral, resp. rate 14, height '4\' 10"'$  (1.473 m), weight 40.4 kg, last menstrual period 07/19/2018.Body mass index is 18.6 kg/m.  General Appearance: Improving grooming  Eye Contact:  Good  Speech:  Normal Rate  Volume:  Normal  Mood:  Some improvement, less  depressed  Affect:  Remains vaguely constricted and anxious, smiles briefly at times appropriately  Thought Process:  Linear and Descriptions of Associations: Intact  Orientation:  Full (Time, Place, and Person)  Thought Content:  No hallucinations, no delusions, not internally preoccupied  Suicidal Thoughts:  No at this time denies suicidal ideations, denies of injurious ideations, contracts for safety on unit, denies homicidal or violent ideations  Homicidal Thoughts:  No  Memory:  Recent and remote grossly intact  Judgement:  Other:  Improving  Insight:  Improving  Psychomotor Activity:  Normal  Concentration:  Concentration: Good and Attention Span: Good  Recall:  Good  Fund of Knowledge:  Good  Language:  Good  Akathisia:  Negative  Handed:  Right  AIMS (if indicated):     Assets:  Communication Skills Desire for Improvement Resilience  ADL's:  Intact  Cognition:  WNL  Sleep:  Number of Hours: 5.5    Assessment - 82 year old single female, had been living with boyfriend recently, describes worsening depression, suicidal thoughts, with thoughts of hanging herself for  the last several days.  Attributes partially to strained relationship with boyfriend she reports kicked her out of the house when they argued.  History of depression, PTSD and a prior psychiatric admission for depression in 2017.  Prior history of alcohol abuse but not recently.  At this time patient presents with partial improvement, describes feeling better than she did prior to admission, denies suicidal ideations/contracts for safety on unit.  Endorses lingering anxiety.  Tolerating Remeron and Neurontin well thus far, slept better last night.  Treatment Plan Summary: Daily contact with patient to assess and evaluate symptoms and progress in treatment, Medication management, Plan inpatient treatment  and medications as below Encourage group and milieu participation to work on coping skills and symptom reduction Increase Neurontin to  300 mg TID for anxiety-side effects and off label use for anxiety reviewed. Continue Remeron 7.5 mg QHS for depression and insomnia Continue Ativan 0.5 mg Q 6 hours PRN for anxiety as needed  Treatment team working on disposition planning options Jenne Campus, MD 08/04/2018, 10:59 AM

## 2018-08-04 NOTE — Plan of Care (Signed)
  Problem: Education: Goal: Emotional status will improve Outcome: Progressing Goal: Mental status will improve Outcome: Progressing   Problem: Activity: Goal: Interest or engagement in activities will improve Outcome: Progressing   D: Patient reports she slept well.  Her affect is flat, blunted; her mood depressed.  She rates her depression, hopelessness, and anxiety as a 5 today.  Her goal today is to "eat."  Patient is pleasant and cooperative.  She denies any thoughts of self harm today.  Her energy level is low, and she rates her concentration level poor.  Patient is visible in the milieu and is interacting well with staff and peers.  A: Continue to monitor medication management and MD orders.  Safety checks completed every 15 minutes per protocol.  Offer support and encouragement as needed.  R: Patient is receptive to staff; her behavior is appropriate.

## 2018-08-05 MED ORDER — MIRTAZAPINE 15 MG PO TABS
15.0000 mg | ORAL_TABLET | Freq: Every day | ORAL | Status: DC
  Administered 2018-08-05: 15 mg via ORAL
  Filled 2018-08-05 (×2): qty 1

## 2018-08-05 NOTE — Progress Notes (Signed)
D: Pt was in her room upon initial approach.  Pt presents with depressed affect and mood.  She reports her day has "been good" and her goal is to "try not to be too depressed, stay busy and keep my mind off of stuff with the holidays and everything."  Pt has been coloring to cope with her depression today.  Pt denies SI/HI, denies hallucinations, denies pain.  Pt has been visible in milieu interacting with peers and staff appropriately.  Pt attended evening group.    A: Introduced self to pt.  Met with pt 1:1.  Actively listened to pt and offered support and encouragement.  Medication administered per order.  Q15 minute safety checks maintained.  R: Pt is safe on the unit.  Pt is compliant with medication.  Pt verbally contracts for safety.  Will continue to monitor and assess.

## 2018-08-05 NOTE — Progress Notes (Signed)
[facility] MD Progress Note  08/05/2018 9:19 AM Courtney Hansen  MRN:  [id number] Subjective: Patient reports that she feels that she is doing a little bit better today.  She does not feel that her depression is improving any.  She states that Neurontin did make her a little sleepy yesterday afternoon and she took a nap for a couple hours.  However, she felt that she was kind of energized last night and had difficulty going to sleep, even after taking the Remeron.  She denies any medication side effects.  She reports that she did take a dose of Ativan last night to help her with sleep.  She denies any suicidal or homicidal ideations and denies any hallucinations.  She reports that her appetite has been improving gradually as well.  Objective:Patient's chart and findings reviewed and discussed with treatment team.  Patient presents in the day room and is pleasant, calm, and cooperative.  Patient has been interacting with peers and staff appropriately.  Patient states that she has been attending groups and is participating.  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  And she understands how peer support works and would like to have some counseling time with a peer support specialist.  Principal Problem:  MDD Diagnosis: Principal Problem:   MDD (major depressive disorder), recurrent episode, severe ([facility])  Total Time spent with patient: 30 minutes  Past Psychiatric History: See H&P  Past Medical History:  Past Medical History:  Diagnosis Date  . Depression   . PTSD (post-traumatic stress disorder)    History reviewed. No pertinent surgical history. Family History:  Family History  Problem Relation Age of Onset  . Schizophrenia Brother   . Depression Brother    Family Psychiatric  History: See H&P Social History:  Social History   Substance and Sexual Activity  Alcohol Use Yes   Comment: occasional drinker     Social  History   Substance and Sexual Activity  Drug Use Yes  . Types: Marijuana    Social History   Socioeconomic History  . Marital status: Divorced    Spouse name: Not on file  . Number of children: Not on file  . Years of education: Not on file  . Highest education level: Not on file  Occupational History  . Not on file  Social Needs  . Financial resource strain: Not on file  . Food insecurity:    Worry: Not on file    Inability: Not on file  . Transportation needs:    Medical: Not on file    Non-medical: Not on file  Tobacco Use  . Smoking status: Current Every Day Smoker    Packs/day: 0.50    Types: Cigarettes  . Smokeless tobacco: Never Used  . Tobacco comment: Pt declined counsel  Substance and Sexual Activity  . Alcohol use: Yes    Comment: occasional drinker  . Drug use: Yes    Types: Marijuana  . Sexual activity: Yes    Birth control/protection: None  Lifestyle  . Physical activity:    Days per week: Not on file    Minutes per session: Not on file  . Stress: Not on file  Relationships  . Social connections:    Talks on phone: Not on file    Gets together: Not on file    Attends religious service: Not on file    Active member of club or organization:  Not on file    Attends meetings of clubs or organizations: Not on file    Relationship status: Not on file  Other Topics Concern  . Not on file  Social History Narrative  . Not on file   Additional Social History:    Pain Medications: See home med list Prescriptions: See home med list Over the Counter: See home med list History of alcohol / drug use?: Yes Negative Consequences of Use: Personal relationships Name of Substance 1: Alcohol 1 - Age of First Use: teens 1 - Amount (size/oz): 1-2 beers 1 - Frequency: less than monthly 1 - Duration: ongoing 1 - Last Use / Amount: "last week" Name of Substance 2: marijuana 2 - Age of First Use: 20's 2 - Amount (size/oz): varies 2 - Frequency: weekly 2 -  Duration: ongoing 2 - Last Use / Amount: "couple of days ago"  Sleep: Fair  Appetite:  Improving  Current Medications: Current Facility-Administered Medications  Medication Dose Route Frequency Provider Last Rate Last Dose  . acetaminophen (TYLENOL) tablet 650 mg  650 mg Oral Q6H PRN Kerry HoughSimon, Spencer E, PA-C      . alum & mag hydroxide-simeth (MAALOX/MYLANTA) 200-200-20 MG/5ML suspension 30 mL  30 mL Oral Q4H PRN Kerry HoughSimon, Spencer E, PA-C   30 mL at 08/04/18 2008  . feeding supplement (ENSURE ENLIVE) (ENSURE ENLIVE) liquid 237 mL  237 mL Oral BID BM Cobos, Rockey SituFernando A, MD   237 mL at 08/04/18 1441  . gabapentin (NEURONTIN) capsule 300 mg  300 mg Oral TID Cobos, Rockey SituFernando A, MD   300 mg at 08/05/18 0810  . LORazepam (ATIVAN) tablet 0.5 mg  0.5 mg Oral Q6H PRN Cobos, Rockey SituFernando A, MD   0.5 mg at 08/04/18 2103  . magnesium hydroxide (MILK OF MAGNESIA) suspension 30 mL  30 mL Oral Daily PRN Donell SievertSimon, Spencer E, PA-C      . mirtazapine (REMERON) tablet 15 mg  15 mg Oral QHS Money, Gerlene Burdockravis B, FNP        Lab Results:  Results for orders placed or performed during the hospital encounter of 08/03/18 (from the past 48 hour(s))  TSH     Status: None   Collection Time: 08/04/18  6:41 AM  Result Value Ref Range   TSH 1.024 0.350 - 4.500 uIU/mL    Comment: Performed by a 3rd Generation assay with a functional sensitivity of <=0.01 uIU/mL. Performed at [facility], [address] [place], [place] [address]     Blood Alcohol level:  Lab Results  Component Value Date   ETH <10 08/02/2018   ETH 178 (H) 06/10/2017    Metabolic Disorder Labs: No results found for: HGBA1C, MPG No results found for: PROLACTIN No results found for: CHOL, TRIG, HDL, CHOLHDL, VLDL, LDLCALC  Physical Findings: AIMS: Facial and Oral Movements Muscles of Facial Expression: None, normal Lips and Perioral Area: None, normal Jaw: None, normal Tongue: None, normal,Extremity Movements Upper (arms, wrists,  hands, fingers): None, normal Lower (legs, knees, ankles, toes): None, normal, Trunk Movements Neck, shoulders, hips: None, normal, Overall Severity Severity of abnormal movements (highest score from questions above): None, normal Incapacitation due to abnormal movements: None, normal Patient's awareness of abnormal movements (rate only patient's report): No Awareness, Dental Status Current problems with teeth and/or dentures?: No Does patient usually wear dentures?: No  CIWA:    COWS:     Musculoskeletal: Strength & Muscle Tone: within normal limits Gait & Station: normal Patient leans: N/A  Psychiatric  Specialty Exam: Physical Exam  Nursing note and vitals reviewed. Constitutional: She is oriented to person, place, and time. She appears well-developed and well-nourished.  Cardiovascular: Normal rate.  Respiratory: Effort normal.  Musculoskeletal: Normal range of motion.  Neurological: She is alert and oriented to person, place, and time.  Skin: Skin is warm.    Review of Systems  Constitutional: Negative.   HENT: Negative.   Eyes: Negative.   Respiratory: Negative.   Cardiovascular: Negative.   Gastrointestinal: Negative.   Genitourinary: Negative.   Musculoskeletal: Negative.   Skin: Negative.   Neurological: Negative.   Endo/Heme/Allergies: Negative.   Psychiatric/Behavioral: Positive for depression. Negative for hallucinations and suicidal ideas. The patient is nervous/anxious.     Blood pressure 106/81, pulse 70, temperature 97.9 F (36.6 C), temperature source Oral, resp. rate 14, height 4\' 10"  (1.473 m), weight 40.4 kg, last menstrual period 07/19/2018.Body mass index is 18.6 kg/m.  General Appearance: Casual  Eye Contact:  Good  Speech:  Normal Rate  Volume:  Normal  Mood:  Depressed  Affect:  Congruent  Thought Process:  Coherent and Descriptions of Associations: Intact  Orientation:  Full (Time, Place, and Person)  Thought Content:  WDL  Suicidal  Thoughts:  No   Homicidal Thoughts:  No  Memory:  Immediate;   Good Recent;   Good Remote;   Good  Judgement:  Fair  Insight:  Fair  Psychomotor Activity:  Normal  Concentration:  Concentration: Good and Attention Span: Good  Recall:  Good  Fund of Knowledge:  Good  Language:  Good  Akathisia:  Negative  Handed:  Right  AIMS (if indicated):     Assets:  Communication Skills Desire for Improvement Housing Resilience Social Support  ADL's:  Intact  Cognition:  WNL  Sleep:  Number of Hours: 6   Problems addressed Major depressive disorder recurrent severe   Treatment Plan Summary: Daily contact with patient to assess and evaluate symptoms and progress in treatment, Medication management and Plan is to:  With the patient's depression not feeling improved and a little difficulty sleeping last night we will increase the Remeron to 15 mg p.o. nightly and educated the patient on the potential of increasing the Remeron to 30 or 45 mg in the future to assist with her depression more. Continue Neurontin 300 mg p.o. 3 times daily for agitation and mood stability Continue Ativan 0.5 every 6 hours as needed for severe anxiety Increase Remeron 15 mg p.o. nightly for sleep and mood stability Encourage group therapy participation  Maryfrances Bunnell, FNP 08/05/2018, 9:19 AM

## 2018-08-05 NOTE — Plan of Care (Signed)
Nurse discussed anxiety, depression, coping skills with patient. 

## 2018-08-05 NOTE — Progress Notes (Signed)
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   MD orders flagyl for her, last taken one month ago.  Denied itching, denied discharge.  Just feels like BV now.  No problems urinating.

## 2018-08-05 NOTE — Progress Notes (Signed)
D:  Patient's self inventory sheet, patient has poor sleep, sleep medication not helpful.  Fair appetite, low energy level, good concentration.  Rated depression and hopeless 5, anxiety 3.  Denied withdrawals.  Denied SI.  Physical problems, vaginal discomfort.  Denied physical pain.  Goal is not be depressed.  Plans to stay busy.  Order for vaginal exam?  No discharge plans. A:  Medications administered per MD orders.  Emotional support and encouragement given patient. R:  Denied SI and HI, contracts for safety.  Denied A/V hallucinations.  Safety maintained with 15 minute checks.

## 2018-08-05 NOTE — Plan of Care (Signed)
  Problem: Activity: Goal: Sleeping patterns will improve Outcome: Progressing Note:  Pt slept 6 hours last night according to flowsheet.

## 2018-08-06 MED ORDER — MIRTAZAPINE 15 MG PO TABS: 15.0000 mg | ORAL_TABLET | Freq: Every day | ORAL | 0 refills | Status: DC

## 2018-08-06 MED ORDER — GABAPENTIN 300 MG PO CAPS: 300.0000 mg | ORAL_CAPSULE | Freq: Three times a day (TID) | ORAL | 0 refills | Status: DC
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
## 2018-08-06 NOTE — Progress Notes (Signed)
Pt received both written and verbal discharge instructions. Pt verbalized understanding of discharge instructions. Pt agreed to f/u appt med regimen. Pt received AVS, transitional record, prescriptions, sample , meds, SRA and suicide prevention. Pt gathered belongings from room and locker. Pt provided with two bus passes per CSW., for transportation. Pt safely discharged to the lobby.

## 2018-08-06 NOTE — Progress Notes (Signed)
  [facility] :  Will you be returning to the same living situation after discharge:  Yes,  patient reports she is discharging home with a friend At discharge, do you have transportation home?: Yes,  bus passes Do you have the ability to pay for your medications: No.  Release of information consent forms completed and in the chart;  Patient's signature needed at discharge.  Patient to Follow up at: Follow-up Information    [facility] Go on 08/11/2018.   Specialty:  Professional Counselor Why:  Your hospital follow up appointment for therapy services is Wednesday, 08/11/18 at 2:45pm with Hosie SpangleMichelle Seeley. Medication management appointment is Thursday,08/26/18 at 2:00pm. Be sure to bring any discharge paperwork for both appointments.  Contact information: [facility] [address] [place] [address] [phone]           Next level of care provider [facility]:yes  Safety Planning and Suicide Prevention discussed: Yes,  with the patient  Have you used any form of tobacco in the last 30 days? (Cigarettes, Smokeless Tobacco, Cigars, and/or Pipes): Yes  Has patient been referred to the Quitline?: Patient refused referral  Patient has been referred for addiction treatment: N/A  Maeola SarahJolan E Dustine Stickler, LCSWA 08/06/2018, 10:43 AM

## 2018-08-06 NOTE — [facility] Suicide Risk Assessment (Signed)
[facility]   Principal Problem: MDD (major depressive disorder), recurrent episode, severe (HCC) Discharge Diagnoses: Principal Problem:   MDD (major depressive disorder), recurrent episode, severe (HCC)   Total Time spent with patient: 30 minutes  Musculoskeletal: Strength & Muscle Tone: within normal limits Gait & Station: normal Patient leans: N/A  Psychiatric Specialty Exam: ROS  Denies headache, no chest pain, no shortness of breath, no vomiting   Blood pressure 110/78, pulse 69, temperature 98.3 F (36.8 C), temperature source Oral, resp. rate 20, height 4\' 10"  (1.473 m), weight 40.4 kg, last menstrual period 07/19/2018.Body mass index is 18.6 kg/m.  General Appearance: Well Groomed  Eye Contact::  Good  Speech:  Normal Rate409  Volume:  Normal  Mood:  improved mood, states " I am feeling OK today"  Affect:  Appropriate and full range   Thought Process:  Linear and Descriptions of Associations: Intact  Orientation:  Full (Time, Place, and Person)  Thought Content:  no hallucinations, no delusions, not internally preoccupied   Suicidal Thoughts:  No denies suicidal or self injurious ideations, denies violent or homicidal ideations  Homicidal Thoughts:  No  Memory:  recent and remote grossly intact   Judgement:  Other:  improving   Insight:  improving   Psychomotor Activity:  Normal  Concentration:  Good  Recall:  Good  Fund of Knowledge:Good  Language: Good  Akathisia:  Negative  Handed:  Right  AIMS (if indicated):     Assets:  Communication Skills Desire for Improvement Resilience  Sleep:  Number of Hours: 5  Cognition: WNL  ADL's:  Intact   Mental Status Per Nursing Assessment::   On Admission:  Self-harm thoughts  Demographic Factors:  42, single, has two adult daughters, unemployed, currently living with her mother  Loss Factors: Relationship stressors, states BF had recently kicked her out , upcoming court  date  Historical Factors: Prior psychiatric admission in 2017 for depression, history of PTSD, history of alcohol abuse in the past   Risk Reduction Factors:   Sense of responsibility to family, Living with another person, especially a relative and Positive coping skills or problem solving skills  Continued Clinical Symptoms:  At this time patient is alert, attentive, well related, pleasant, mood is improved and currently euthymic, states " I don't remember feeling this good for a while", affect more reactive,  no thought disorder, no suicidal or self injurious ideations, no homicidal or violent ideations, no hallucinations,no delusions , future oriented . No disruptive or agitated behaviors on unit, pleasant on approach. No medication side effects. We reviewed side effect profile, including risk of sedation.  Cognitive Features That Contribute To Risk:  No gross cognitive deficits noted upon discharge. Is alert , attentive, and oriented x 3    Suicide Risk:  Mild:  Suicidal ideation of limited frequency, intensity, duration, and specificity.  There are no identifiable plans, no associated intent, mild dysphoria and related symptoms, good self-control (both objective and subjective assessment), few other risk factors, and identifiable protective factors, including available and accessible social support.  Follow-up Information    [facility] Go on 08/11/2018.   Specialty:  Professional Counselor Why:  Your hospital follow up appointment for therapy services is Wednesday, 08/11/18 at 2:45pm with Hosie SpangleMichelle Seeley. Medication management appointment is Thursday,08/26/18 at 2:00pm. Be sure to bring any discharge paperwork for both appointments.  Contact information: [facility] [address] [place] [address] [phone]  Plan Of Care/Follow-up recommendations:  Activity:  as tolerated  Diet:  regular Tests:  NA Other:   See below  Patient is expressing readiness for discharge, and leaving unit in good spirits  Plans to return to live with mother Follow up as above  She also has a PCP at [facility] for medical issues as needed.  Craige Cotta, MD 08/06/2018, 9:29 AM

## 2018-08-06 NOTE — [facility] Group Notes (Signed)
Adult Psychoeducational Group Note  Date:  08/06/2018 Time:  2:10 AM  Group Topic/Focus:  Wrap-Up Group:   The focus of this group is to help patients review their daily goal of treatment and discuss progress on daily workbooks.  Participation Level:  Active  Participation Quality:  Appropriate and Attentive  Affect:  Appropriate  Cognitive:  Alert and Appropriate  Insight: Appropriate and Good  Engagement in Group:  Engaged  Modes of Intervention:  Discussion and Education  Additional Comments:  Pt attended and participated in wrap up group this evening. Pt rated their day a 5/10, due to them coloring to stay busy due to them feeling "stir crazy" from being here for 3 days. Pt completed their goal, which was to not be depressed and to stay busy.    Chrisandra NettersOctavia A Arran Fessel 08/06/2018, 2:10 AM

## 2018-11-02 ENCOUNTER — Other Ambulatory Visit: Payer: Self-pay | Admitting: *Deleted

## 2018-11-02 DIAGNOSIS — Z1231 Encounter for screening mammogram for malignant neoplasm of breast: Secondary | ICD-10-CM

## 2018-12-13 ENCOUNTER — Other Ambulatory Visit: Payer: Self-pay

## 2018-12-13 ENCOUNTER — Encounter (HOSPITAL_COMMUNITY): Payer: Self-pay

## 2018-12-13 ENCOUNTER — Emergency Department (HOSPITAL_COMMUNITY)
Admission: EM | Admit: 2018-12-13 | Discharge: 2018-12-13 | Disposition: A | Discharge status: Home / Self Care | Payer: Self-pay | Attending: Emergency Medicine | Admitting: Emergency Medicine

## 2018-12-13 ENCOUNTER — Emergency Department (HOSPITAL_COMMUNITY): Payer: Self-pay

## 2018-12-13 DIAGNOSIS — F419 Anxiety disorder, unspecified: Secondary | ICD-10-CM | POA: Insufficient documentation

## 2018-12-13 DIAGNOSIS — Z79899 Other long term (current) drug therapy: Secondary | ICD-10-CM | POA: Insufficient documentation

## 2018-12-13 DIAGNOSIS — F1510 Other stimulant abuse, uncomplicated: Secondary | ICD-10-CM | POA: Insufficient documentation

## 2018-12-13 DIAGNOSIS — F17210 Nicotine dependence, cigarettes, uncomplicated: Secondary | ICD-10-CM | POA: Insufficient documentation

## 2018-12-13 MED ORDER — HYDROXYZINE HCL 25 MG PO TABS: 25.0000 mg | ORAL_TABLET | Freq: Four times a day (QID) | ORAL | 0 refills | Status: DC

## 2018-12-13 MED ORDER — HYDROXYZINE HCL 25 MG PO TABS
25.0000 mg | ORAL_TABLET | Freq: Once | ORAL | Status: AC
  Administered 2018-12-13: 12:00:00 25 mg via ORAL
  Filled 2018-12-13: qty 1

## 2018-12-13 NOTE — ED Notes (Signed)
Bed: WTR7 Expected date:  Expected time:  Means of arrival:  Comments: Hold- bronchospasms

## 2018-12-13 NOTE — ED Provider Notes (Signed)
[facility]-EMERGENCY DEPT Provider Note   CSN: [id number] Arrival date & time: 12/13/18  1140    History   Chief Complaint Chief Complaint  Patient presents with  . Shortness of Breath    HPI Courtney Hansen is a 43 y.o. female.     43 year old female brought in by EMS for complaint of bronchospasm.  Patient is a difficult historian, does not answer all questions and cycles with repetitive thoughts and statements.  Patient states that she was diagnosed with bronchospasm 3 years ago, states she has not had any episodes until the past few weeks.  Patient states that she has tried using breathing exercises, hanging herself in the chest and an albuterol inhaler with occasional relief of her symptoms.  Patient did a virtual visit with her PCP a week ago and was given prednisone to take for the 5 days which she felt like helped at the time.  Patient states that she feels like she is going to pass out at any minute, states that she feels like she has inflammation in her chest and while she is told that her oxygen saturation is good she feels like she is unable to breathe well.  Patient also reports smoking meth for the past few days which is not something she has done in the past.  Patient is also a daily smoker.  Patient reports history of anxiety, PTSD, insomnia. Denies chest pain, fevers, cough, URI symptoms.   NIKEA Hansen was evaluated in Emergency Department on 12/13/2018 for the symptoms described in the history of present illness. She was evaluated in the context of the global COVID-19 pandemic, which necessitated consideration that the patient might be at risk for infection with the SARS-CoV-2 virus that causes COVID-19. Institutional protocols and algorithms that pertain to the evaluation of patients at risk for COVID-19 are in a state of rapid change based on information released by regulatory bodies including the CDC and federal and state organizations. These policies and  algorithms were followed during the patient's care in the ED.      Past Medical History:  Diagnosis Date  . Depression   . PTSD (post-traumatic stress disorder)     Patient Active Problem List   Diagnosis Date Noted  . MDD (major depressive disorder), recurrent episode, severe (HCC) 08/03/2018  . Alcohol use disorder, severe, dependence (HCC) 06/10/2017  . Clonidine overdose 04/28/2017  . Alcohol use 04/28/2017  . Intentional drug overdose (HCC)   . Severe episode of recurrent major depressive disorder, without psychotic features (HCC)     History reviewed. No pertinent surgical history.   OB History   No obstetric history on file.      Home Medications    Prior to Admission medications   Medication Sig Start Date End Date Taking? Authorizing Provider  gabapentin (NEURONTIN) 300 MG capsule Take 1 capsule (300 mg total) by mouth 3 (three) times daily. 08/06/18   Oneta Rack, NP  hydrOXYzine (ATARAX/VISTARIL) 25 MG tablet Take 1 tablet (25 mg total) by mouth every 6 (six) hours. 12/13/18   Jeannie Fend, PA-C  mirtazapine (REMERON) 15 MG tablet Take 1 tablet (15 mg total) by mouth at bedtime. 08/06/18   Oneta Rack, NP    Family History Family History  Problem Relation Age of Onset  . Schizophrenia Brother   . Depression Brother     Social History Social History   Tobacco Use  . Smoking status: Current Every Day Smoker  Packs/day: 0.50    Types: Cigarettes  . Smokeless tobacco: Never Used  . Tobacco comment: Pt declined counsel  Substance Use Topics  . Alcohol use: Yes    Comment: occasional drinker  . Drug use: Yes    Types: Marijuana, Methamphetamines     Allergies   Patient has no known allergies.   Review of Systems Review of Systems  Unable to perform ROS: Psychiatric disorder  Constitutional: Negative for chills, diaphoresis and fever.  HENT: Negative for congestion, sneezing and sore throat.   Respiratory: Positive for shortness  of breath. Negative for cough and wheezing.   Cardiovascular: Negative for chest pain, palpitations and leg swelling.  Gastrointestinal: Negative for abdominal pain, nausea and vomiting.  Skin: Negative for rash and wound.  Allergic/Immunologic: Negative for immunocompromised state.  Neurological: Negative for weakness.  All other systems reviewed and are negative.    Physical Exam Updated Vital Signs BP 131/75   Pulse 92   Temp 98.4 F (36.9 C)   Resp 19   Ht 4\' 10"  (1.473 m)   Wt 45.4 kg   SpO2 100%   BMI 20.90 kg/m   Physical Exam Vitals signs and nursing note reviewed.  Constitutional:      General: She is not in acute distress.    Appearance: She is well-developed. She is not diaphoretic.  HENT:     Head: Normocephalic and atraumatic.     Mouth/Throat:     Mouth: Mucous membranes are moist.  Cardiovascular:     Rate and Rhythm: Normal rate and regular rhythm.     Heart sounds: Normal heart sounds.  Pulmonary:     Effort: Pulmonary effort is normal. No respiratory distress.     Breath sounds: Normal breath sounds. No decreased breath sounds, wheezing, rhonchi or rales.     Comments: Able to speak in full sentences without difficulty  Chest:     Chest wall: No deformity or tenderness.  Abdominal:     Palpations: Abdomen is soft.     Tenderness: There is no abdominal tenderness.  Musculoskeletal:     Right lower leg: No edema.     Left lower leg: No edema.  Skin:    General: Skin is warm and dry.     Findings: No erythema.  Neurological:     Mental Status: She is alert and oriented to person, place, and time.  Psychiatric:        Mood and Affect: Mood is anxious. Affect is inappropriate.        Speech: Speech is rapid and pressured.     Comments: Frequent body spasms on extremities and pelvis. Patient is able to stop these movements at times to demonstrate how she was hitting herself in the chest in attempt to stop her bronchospasms.       ED Treatments /  Results  Labs (all labs ordered are listed, but only abnormal results are displayed) Labs Reviewed - No data to display  EKG EKG Interpretation  Date/Time:  Monday December 13 2018 12:33:29 EDT Ventricular Rate:  86 PR Interval:    QRS Duration: 69 QT Interval:  403 QTC Calculation: 482 R Axis:   77 Text Interpretation:  Sinus rhythm No significant change was found Confirmed by Azalia Bilisampos, Kevin ([id number]) on 12/13/2018 12:54:24 PM   Radiology Dg Chest Port 1 View  Result Date: 12/13/2018 CLINICAL DATA:  Shortness of breath EXAM: PORTABLE CHEST 1 VIEW COMPARISON:  11/30/2016 FINDINGS: The heart size and mediastinal contours are within  normal limits. Both lungs are clear. The visualized skeletal structures are unremarkable. IMPRESSION: No active disease. Electronically Signed   By: Elige Ko   On: 12/13/2018 12:32    Procedures Procedures (including critical care time)  Medications Ordered in ED Medications  hydrOXYzine (ATARAX/VISTARIL) tablet 25 mg (25 mg Oral Given 12/13/18 1222)     Initial Impression / Assessment and Plan / ED Course  I have reviewed the triage vital signs and the nursing notes.  Pertinent labs & imaging results that were available during my care of the patient were reviewed by me and considered in my medical decision making (see chart for details).  Clinical Course as of Dec 13 1311  Mon Dec 13, 2018  5289 43 year old female brought in by EMS.  Patient states she feels like her bronchioles are closing up on her, similar to previous episode of bronchospasm 3 years ago.  She is able to speak in complete sentences, vitals unremarkable, lung sounds are clear.  Patient with similar involuntary body jerking movements which she is able to stop to demonstrate how she uses her hands to hit on her chest to try to open up her airways.  Discussed results with patient, chest x-ray is reassuring, EKG without ischemic changes.  Symptoms likely due to underlying psychiatric disorder  and now smoking meth.  Patient was given Atarax and advised to follow-up with her PCP. Case discussed with Dr. Patria Mane, ER attending, agrees with plan of care.   [LM]    Clinical Course User Index [LM] Jeannie Fend, PA-C      Final Clinical Impressions(s) / ED Diagnoses   Final diagnoses:  Anxiety  Methamphetamine abuse [facility])    ED Discharge Orders         Ordered    hydrOXYzine (ATARAX/VISTARIL) 25 MG tablet  Every 6 hours     12/13/18 1259           Alden Hipp 12/13/18 1313    Azalia Bilis, MD 12/15/18 [phone]

## 2018-12-13 NOTE — ED Notes (Signed)
Courtney Hansen- friend [phone]

## 2018-12-13 NOTE — Discharge Instructions (Addendum)
Follow up with your doctor. Take Atarax as needed as prescribed for anxiety.

## 2018-12-13 NOTE — ED Notes (Signed)
Pt calls out stating she cannot breathe. Pt is speaking in full sentences.

## 2018-12-13 NOTE — ED Triage Notes (Signed)
Pt BIBA from side of the road. Pt c/o shortness of breath. Pt has hx of bronchospasms and started having issues again 3 weeks ago. Pt did virtual visit, where she was given rx for prednisone. Pt states that she had gotten better, but now feels the bronchospasming increasing and getting tired. Pt satting 100% RA. Pt recevied 125 solumedrol en route. Pt admits to meth and marijuana use yesterday. She used meth for the first time she states. Pt denies any drug use since 0500. Pt is very anxious appearing.

## 2018-12-13 NOTE — ED Notes (Signed)
DJ, EMT to room. Pt is stating she cannot breathe. Pt also told DJ about meth use last night.

## 2019-03-03 ENCOUNTER — Other Ambulatory Visit: Payer: Self-pay

## 2019-03-03 ENCOUNTER — Ambulatory Visit
Admission: RE | Admit: 2019-03-03 | Discharge: 2019-03-03 | Disposition: A | Discharge status: Home / Self Care | Payer: No Typology Code available for payment source | Source: Ambulatory Visit | Attending: *Deleted | Admitting: *Deleted

## 2019-03-03 DIAGNOSIS — Z1231 Encounter for screening mammogram for malignant neoplasm of breast: Secondary | ICD-10-CM

## 2019-08-14 ENCOUNTER — Other Ambulatory Visit: Payer: Self-pay

## 2019-08-14 ENCOUNTER — Inpatient Hospital Stay (HOSPITAL_COMMUNITY)
Admission: AD | Admit: 2019-08-14 | Discharge: 2019-08-17 | DRG: 885 | Disposition: A | Discharge status: Home / Self Care | Payer: No Typology Code available for payment source | Source: Intra-hospital | Attending: Psychiatry | Admitting: Psychiatry

## 2019-08-14 ENCOUNTER — Emergency Department (HOSPITAL_COMMUNITY)
Admission: EM | Admit: 2019-08-14 | Discharge: 2019-08-14 | Disposition: A | Discharge status: Other Acute Inpatient Hospital | Payer: No Typology Code available for payment source | Attending: Emergency Medicine | Admitting: Emergency Medicine

## 2019-08-14 ENCOUNTER — Encounter (HOSPITAL_COMMUNITY): Payer: Self-pay

## 2019-08-14 DIAGNOSIS — F332 Major depressive disorder, recurrent severe without psychotic features: Secondary | ICD-10-CM | POA: Diagnosis present

## 2019-08-14 DIAGNOSIS — R45851 Suicidal ideations: Secondary | ICD-10-CM | POA: Insufficient documentation

## 2019-08-14 DIAGNOSIS — F17210 Nicotine dependence, cigarettes, uncomplicated: Secondary | ICD-10-CM | POA: Diagnosis present

## 2019-08-14 DIAGNOSIS — Y906 Blood alcohol level of 120-199 mg/100 ml: Secondary | ICD-10-CM | POA: Diagnosis present

## 2019-08-14 DIAGNOSIS — F1290 Cannabis use, unspecified, uncomplicated: Secondary | ICD-10-CM | POA: Diagnosis present

## 2019-08-14 DIAGNOSIS — F1490 Cocaine use, unspecified, uncomplicated: Secondary | ICD-10-CM | POA: Diagnosis present

## 2019-08-14 DIAGNOSIS — F1020 Alcohol dependence, uncomplicated: Secondary | ICD-10-CM | POA: Diagnosis present

## 2019-08-14 DIAGNOSIS — Z915 Personal history of self-harm: Secondary | ICD-10-CM

## 2019-08-14 DIAGNOSIS — F4310 Post-traumatic stress disorder, unspecified: Secondary | ICD-10-CM | POA: Diagnosis present

## 2019-08-14 DIAGNOSIS — G4700 Insomnia, unspecified: Secondary | ICD-10-CM | POA: Diagnosis present

## 2019-08-14 DIAGNOSIS — Z20828 Contact with and (suspected) exposure to other viral communicable diseases: Secondary | ICD-10-CM | POA: Diagnosis present

## 2019-08-14 DIAGNOSIS — Z23 Encounter for immunization: Secondary | ICD-10-CM | POA: Insufficient documentation

## 2019-08-14 DIAGNOSIS — Z818 Family history of other mental and behavioral disorders: Secondary | ICD-10-CM

## 2019-08-14 DIAGNOSIS — Z79899 Other long term (current) drug therapy: Secondary | ICD-10-CM | POA: Insufficient documentation

## 2019-08-14 LAB — RAPID URINE DRUG SCREEN, HOSP PERFORMED
Amphetamines: NOT DETECTED
Barbiturates: NOT DETECTED
Benzodiazepines: NOT DETECTED
Cocaine: POSITIVE — AB
Opiates: NOT DETECTED
Tetrahydrocannabinol: POSITIVE — AB

## 2019-08-14 LAB — COMPREHENSIVE METABOLIC PANEL
ALT: 17 U/L (ref 0–44)
AST: 23 U/L (ref 15–41)
Albumin: 5.4 g/dL — ABNORMAL HIGH (ref 3.5–5.0)
Alkaline Phosphatase: 57 U/L (ref 38–126)
Anion gap: 12 (ref 5–15)
BUN: 9 mg/dL (ref 6–20)
CO2: 23 mmol/L (ref 22–32)
Calcium: 9.3 mg/dL (ref 8.9–10.3)
Chloride: 108 mmol/L (ref 98–111)
Creatinine, Ser: 0.68 mg/dL (ref 0.44–1.00)
GFR calc Af Amer: >60 mL/min (ref >60)
GFR calc non Af Amer: >60 mL/min (ref >60)
Glucose, Bld: 97 mg/dL (ref 70–99)
Potassium: 3.6 mmol/L (ref 3.5–5.1)
Sodium: 143 mmol/L (ref 135–145)
Total Bilirubin: 0.5 mg/dL (ref 0.3–1.2)
Total Protein: 8.4 g/dL — ABNORMAL HIGH (ref 6.5–8.1)

## 2019-08-14 LAB — ACETAMINOPHEN LEVEL: Acetaminophen (Tylenol), Serum: <10 ug/mL — ABNORMAL LOW (ref 10–30)

## 2019-08-14 LAB — CBC
HCT: 39.5 % (ref 36.0–46.0)
Hemoglobin: 12.8 g/dL (ref 12.0–15.0)
MCH: 33.1 pg (ref 26.0–34.0)
MCHC: 32.4 g/dL (ref 30.0–36.0)
MCV: 102.1 fL — ABNORMAL HIGH (ref 80.0–100.0)
Platelets: 336 10*3/uL (ref 150–400)
RBC: 3.87 MIL/uL (ref 3.87–5.11)
RDW: 13.1 % (ref 11.5–15.5)
WBC: 8.5 10*3/uL (ref 4.0–10.5)
nRBC: 0 % (ref 0.0–0.2)

## 2019-08-14 LAB — SARS CORONAVIRUS 2 (TAT 6-24 HRS): SARS Coronavirus 2: NEGATIVE

## 2019-08-14 LAB — I-STAT BETA HCG BLOOD, ED (MC, WL, AP ONLY): I-stat hCG, quantitative: <5 m[IU]/mL (ref <5)

## 2019-08-14 LAB — SALICYLATE LEVEL: Salicylate Lvl: <7 mg/dL (ref 2.8–30.0)

## 2019-08-14 LAB — ETHANOL: Alcohol, Ethyl (B): 130 mg/dL — ABNORMAL HIGH (ref <10)

## 2019-08-14 MED ORDER — TETANUS-DIPHTH-ACELL PERTUSSIS 5-2.5-18.5 LF-MCG/0.5 IM SUSP
0.5000 mL | Freq: Once | INTRAMUSCULAR | Status: AC
  Administered 2019-08-14: 0.5 mL via INTRAMUSCULAR
  Filled 2019-08-14: qty 0.5

## 2019-08-14 MED ORDER — GABAPENTIN 300 MG PO CAPS
300.0000 mg | ORAL_CAPSULE | Freq: Three times a day (TID) | ORAL | Status: DC
  Administered 2019-08-14: 300 mg via ORAL
  Filled 2019-08-14 (×2): qty 1

## 2019-08-14 MED ORDER — LORAZEPAM 1 MG PO TABS
1.0000 mg | ORAL_TABLET | Freq: Once | ORAL | Status: DC
  Filled 2019-08-14: qty 1

## 2019-08-14 MED ORDER — MIDAZOLAM HCL 2 MG/2ML IJ SOLN
5.0000 mg | Freq: Once | INTRAMUSCULAR | Status: AC
  Administered 2019-08-14: 23:00:00 via INTRAMUSCULAR

## 2019-08-14 MED ORDER — MIDAZOLAM HCL 2 MG/2ML IJ SOLN
INTRAMUSCULAR | Status: AC
  Administered 2019-08-14: 23:00:00 via INTRAMUSCULAR
  Filled 2019-08-14: qty 6

## 2019-08-14 MED ORDER — MIRTAZAPINE 7.5 MG PO TABS
15.0000 mg | ORAL_TABLET | Freq: Every day | ORAL | Status: DC
  Filled 2019-08-14: qty 2

## 2019-08-14 NOTE — ED Notes (Signed)
Patient assisted into purple scrubs, two bags of patient belongings taken and secured behind the nurse's station.

## 2019-08-14 NOTE — BH Assessment (Signed)
Courtney Hansen, [facility] at [facility], said Pt has been accepted to room 300-1 pending a resulted COVID test. Notified Dr. Theotis Burrow and TCU staff of acceptance. Dr. Rex Kras says Pt has been petitioned for IVC.   Evelena Peat, [facility], [facility] Triage Specialist [phone]

## 2019-08-14 NOTE — ED Notes (Signed)
Patient request to leave AMA, notified MD, MD arrived to room and explained that she will IVC for her safety. Patient then runs out to the main ED but was able to detained by SI sitters and securities.  IVC paper in process. [place] also called to informed that her room is ready but now waiting for covid test result.  Patient refused her night time meds.  She is sitting on the bed in room 27. Securities guarding at the door.

## 2019-08-14 NOTE — ED Provider Notes (Signed)
PT being held for inpatient placement after meeting inpatient criteria per psychiatry team.  I was contacted by her nurse because she wanted to leave.  I evaluated the patient and explained that her worsening suicidal ideations are concerning and the psychiatry team does not feel that she is safe for discharge.  Patient became upset and stated that she was going to leave.  I had spoken with behavioral health team, Marijean Bravo, who discussed w/ PA Gwenlyn Found, and we all agreed that patient required IVC. PT was detained by security when she tried to walk out. I have completed IVC papers and patient has bed available at [facility] when COVID-19 test is complete.   Egbert Seidel, Wenda Overland, MD 08/14/19 2128

## 2019-08-14 NOTE — ED Notes (Signed)
Patient arrived to unit with c/o SI. Patient calm and cooperative at this time. Will continue to monitor

## 2019-08-14 NOTE — ED Notes (Signed)
Patient refused all PO med and vitals, securities called to hold patient down for vitals,  Versed 5 mg IM given left deltoid

## 2019-08-14 NOTE — ED Provider Notes (Signed)
[facility] DEPT Provider Note   CSN: [id number] Arrival date & time: 08/14/19  1021     History   Chief Complaint Chief Complaint  Patient presents with  . Suicidal    HPI Courtney Hansen is a 43 y.o. female with past medical history significant for depression and PTSD presents to emergency department today with chief complaint of suicidal ideations x 3 weeks. She states she has a history of suicidal thoughts, but they usually do not last this long.  She has been unable to get out of bed because she feels so depressed and is unable to stop crying.  She admits to cutting bilateral wrists and inner thighs with box cutter as an attempt to release pain.  She has a therapist but has been unable to see them in the last 6 months because of the pandemic.  Admits to drinking alcohol and smoking marijuana.   Denies any physical complaints or pain.  Denies fever, chills, congestion, chest pain, abdominal pain, nausea or vomiting, urinary symptoms, diarrhea.  Denies any sick contacts.  Denies any homicidal ideations.  Denies auditory and visual hallucinations. Unsure  If tetanus is UTP. History provided by patient with additional history obtained from chart review.      Past Medical History:  Diagnosis Date  . Depression   . PTSD (post-traumatic stress disorder)     Patient Active Problem List   Diagnosis Date Noted  . MDD (major depressive disorder), recurrent episode, severe ([place]) 08/03/2018  . Alcohol use disorder, severe, dependence ([place]) 06/10/2017  . Clonidine overdose 04/28/2017  . Alcohol use 04/28/2017  . Intentional drug overdose ([place])   . Severe episode of recurrent major depressive disorder, without psychotic features ([place])     History reviewed. No pertinent surgical history.   OB History   No obstetric history on file.      Home Medications    Prior to Admission medications   Medication Sig Start Date End Date Taking? Authorizing Provider   gabapentin (NEURONTIN) 300 MG capsule Take 1 capsule (300 mg total) by mouth 3 (three) times daily. 08/06/18  Yes Derrill Center, NP  mirtazapine (REMERON) 15 MG tablet Take 1 tablet (15 mg total) by mouth at bedtime. 08/06/18  Yes Derrill Center, NP  hydrOXYzine (ATARAX/VISTARIL) 25 MG tablet Take 1 tablet (25 mg total) by mouth every 6 (six) hours. Patient not taking: Reported on 08/14/2019 12/13/18   Tacy Learn, PA-C    Family History Family History  Problem Relation Age of Onset  . Schizophrenia Brother   . Depression Brother     Social History Social History   Tobacco Use  . Smoking status: Current Every Day Smoker    Packs/day: 0.50    Types: Cigarettes  . Smokeless tobacco: Never Used  . Tobacco comment: Pt declined counsel  Substance Use Topics  . Alcohol use: Yes    Comment: occasional drinker  . Drug use: Yes    Types: Marijuana, Methamphetamines     Allergies   Patient has no known allergies.   Review of Systems Review of Systems All other systems are reviewed and are negative for acute change except as noted in the HPI.   Physical Exam Updated Vital Signs BP (!) 170/106 (BP Location: Left Arm)   Pulse 97   Temp 98 F (36.7 C) (Oral)   Resp 18   LMP 08/09/2019 (Approximate)   SpO2 96%   Physical Exam Vitals signs and nursing note reviewed.  Constitutional:      General: She is not in acute distress.    Appearance: She is not ill-appearing.  HENT:     Head: Normocephalic and atraumatic.     Right Ear: Tympanic membrane and external ear normal.     Left Ear: Tympanic membrane and external ear normal.     Nose: Nose normal.     Mouth/Throat:     Mouth: Mucous membranes are moist.     Pharynx: Oropharynx is clear.  Eyes:     General: No scleral icterus.       Right eye: No discharge.        Left eye: No discharge.     Extraocular Movements: Extraocular movements intact.     Conjunctiva/sclera: Conjunctivae normal.     Pupils: Pupils are  equal, round, and reactive to light.  Neck:     Musculoskeletal: Normal range of motion.     Vascular: No JVD.  Cardiovascular:     Rate and Rhythm: Normal rate and regular rhythm.     Pulses: Normal pulses.          Radial pulses are 2+ on the right side and 2+ on the left side.     Heart sounds: Normal heart sounds.  Pulmonary:     Comments: Lungs clear to auscultation in all fields. Symmetric chest rise. No wheezing, rales, or rhonchi. Abdominal:     Comments: Abdomen is soft, non-distended, and non-tender in all quadrants. No rigidity, no guarding. No peritoneal signs.  Musculoskeletal: Normal range of motion.     Comments: Full range of motion of bilateral wrists  Skin:    General: Skin is warm and dry.     Capillary Refill: Capillary refill takes less than 2 seconds.     Comments: Superficial lacerations to bilateral wrists and inner thighs.  No surrounding erythema or drainage.  No signs of infection.  No bleeding noted.  Neurological:     Mental Status: She is oriented to person, place, and time.     GCS: GCS eye subscore is 4. GCS verbal subscore is 5. GCS motor subscore is 6.     Comments: Fluent speech, no facial droop.  Psychiatric:        Mood and Affect: Mood is not anxious. Affect is tearful.        Behavior: Behavior normal.        Thought Content: Thought content is not paranoid. Thought content includes suicidal ideation. Thought content does not include homicidal ideation. Thought content includes suicidal plan. Thought content does not include homicidal plan.      ED Treatments / Results  Labs (all labs ordered are listed, but only abnormal results are displayed) Labs Reviewed  COMPREHENSIVE METABOLIC PANEL - Abnormal; Notable for the following components:      Result Value   Total Protein 8.4 (*)    Albumin 5.4 (*)    All other components within normal limits  CBC - Abnormal; Notable for the following components:   MCV 102.1 (*)    All other components  within normal limits  RAPID URINE DRUG SCREEN, HOSP PERFORMED - Abnormal; Notable for the following components:   Cocaine POSITIVE (*)    Tetrahydrocannabinol POSITIVE (*)    All other components within normal limits  SARS CORONAVIRUS 2 (TAT 6-24 HRS)  ETHANOL  SALICYLATE LEVEL  ACETAMINOPHEN LEVEL  I-STAT BETA HCG BLOOD, ED ([facility], [facility], [facility] ONLY)    EKG None  Radiology No results found.  Procedures Procedures (including critical care time)  Medications Ordered in ED Medications  Tdap (BOOSTRIX) injection 0.5 mL (has no administration in time range)     Initial Impression / Assessment and Plan / ED Course  I have reviewed the triage vital signs and the nursing notes.  Pertinent labs & imaging results that were available during my care of the patient were reviewed by me and considered in my medical decision making (see chart for details).  No medical complaints today.  Labs ordered and reviewed. They are unremarkable besides UDS being positive for cocaine and tetrahydrocannabinol and ethanol level 130. Patient appears clinically sober, ambulates with steady gait and has clear speech. Pt is medically cleared for TTS evaluation, disposition per [facility].   Wound care provided, tetanus updated today.  Patient meets inpatient criteria per [facility] Berneice Heinrich NP. Home medications ordered. Patient is stable at time of disposition. Pending placement.   Portions of this note were generated with Scientist, clinical (histocompatibility and immunogenetics). Dictation errors may occur despite best attempts at proofreading.   Final Clinical Impressions(s) / ED Diagnoses   Final diagnoses:  Suicidal ideation    ED Discharge Orders    None       Kathyrn Lass 08/14/19 1526    Terrilee Files, MD 08/14/19 1818

## 2019-08-14 NOTE — ED Triage Notes (Signed)
Pt presents with c/o suicidal thoughts. Pt reports she has complex PTSD and that she has been unable to stop crying. Pt reports she cut her arms 2 days ago.

## 2019-08-14 NOTE — BH Assessment (Signed)
[facility] Assessment Progress Note    Per Letitia Libra, NP, Inpatient is recommended

## 2019-08-14 NOTE — ED Notes (Signed)
Sitter at bedside with patient. Patient given a coke.

## 2019-08-14 NOTE — ED Notes (Signed)
Patient does not endorse a specific plan for suicide, says she has constant suicidal ideations, tearful upon questioning and numerous shallow cuts on bilateral wrists noted.

## 2019-08-14 NOTE — ED Notes (Signed)
[employer] here to escort pat to [facility]. 2 bags pf belonging sent with patient
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
## 2019-08-14 NOTE — ED Notes (Signed)
Report called to Lowella Curb RN at [facility].  They are unable to accept patient till after 2300.  Will update [facility] for any changes.

## 2019-08-14 NOTE — ED Notes (Signed)
[employer] here to escort patient to [facility]

## 2019-08-15 ENCOUNTER — Other Ambulatory Visit: Payer: Self-pay

## 2019-08-15 ENCOUNTER — Encounter (HOSPITAL_COMMUNITY): Payer: Self-pay

## 2019-08-15 DIAGNOSIS — F332 Major depressive disorder, recurrent severe without psychotic features: Secondary | ICD-10-CM | POA: Diagnosis present

## 2019-08-15 MED ORDER — GABAPENTIN 300 MG PO CAPS
300.0000 mg | ORAL_CAPSULE | Freq: Three times a day (TID) | ORAL | Status: DC
  Filled 2019-08-15 (×4): qty 1
  Filled 2019-08-15: qty 21
  Filled 2019-08-15: qty 1
  Filled 2019-08-15 (×2): qty 21
  Filled 2019-08-15 (×5): qty 1

## 2019-08-15 MED ORDER — LORAZEPAM 1 MG PO TABS: 1.0000 mg | ORAL_TABLET | Freq: Four times a day (QID) | ORAL | Status: DC | PRN

## 2019-08-15 MED ORDER — VITAMIN B-1 100 MG PO TABS
100.0000 mg | ORAL_TABLET | Freq: Every day | ORAL | Status: DC
  Filled 2019-08-15 (×4): qty 1

## 2019-08-15 MED ORDER — VENLAFAXINE HCL ER 37.5 MG PO CP24
37.5000 mg | ORAL_CAPSULE | Freq: Every day | ORAL | Status: DC
  Filled 2019-08-15 (×2): qty 1

## 2019-08-15 MED ORDER — MAGNESIUM HYDROXIDE 400 MG/5ML PO SUSP: 30.0000 mL | Freq: Every day | ORAL | Status: DC | PRN

## 2019-08-15 MED ORDER — MIRTAZAPINE 15 MG PO TABS
15.0000 mg | ORAL_TABLET | Freq: Every day | ORAL | Status: DC
  Filled 2019-08-15 (×2): qty 1

## 2019-08-15 MED ORDER — THIAMINE HCL 100 MG/ML IJ SOLN: 100.0000 mg | Freq: Once | INTRAMUSCULAR | Status: DC

## 2019-08-15 MED ORDER — ONDANSETRON 4 MG PO TBDP: 4.0000 mg | ORAL_TABLET | Freq: Four times a day (QID) | ORAL | Status: DC | PRN

## 2019-08-15 MED ORDER — HYDROXYZINE HCL 25 MG PO TABS: 25.0000 mg | ORAL_TABLET | Freq: Three times a day (TID) | ORAL | Status: DC | PRN

## 2019-08-15 MED ORDER — ACETAMINOPHEN 325 MG PO TABS: 650.0000 mg | ORAL_TABLET | Freq: Four times a day (QID) | ORAL | Status: DC | PRN

## 2019-08-15 MED ORDER — HYDROXYZINE HCL 25 MG PO TABS
25.0000 mg | ORAL_TABLET | Freq: Four times a day (QID) | ORAL | Status: DC | PRN
  Filled 2019-08-15: qty 1

## 2019-08-15 MED ORDER — ADULT MULTIVITAMIN W/MINERALS CH
1.0000 | ORAL_TABLET | Freq: Every day | ORAL | Status: DC
  Filled 2019-08-15 (×5): qty 1

## 2019-08-15 MED ORDER — ALUM & MAG HYDROXIDE-SIMETH 200-200-20 MG/5ML PO SUSP: 30.0000 mL | ORAL | Status: DC | PRN

## 2019-08-15 MED ORDER — LOPERAMIDE HCL 2 MG PO CAPS: 2.0000 mg | ORAL_CAPSULE | ORAL | Status: DC | PRN

## 2019-08-15 NOTE — Progress Notes (Signed)
Spiritual care group on grief and loss facilitated by chaplain Xzavien Harada MDiv, [facility]  Group Goal:  Support / Education around grief and loss Members engage in facilitated group support and psycho-social education.  Group Description:  Following introductions and group rules, group members engaged in facilitated group dialog and support around topic of loss, with particular support around experiences of loss in their lives. Group Identified types of loss (relationships / self / things) and identified patterns, circumstances, and changes that precipitate losses. Reflected on thoughts / feelings around loss, normalized grief responses, and recognized variety in grief experience.   Group noted Worden's four tasks of grief in discussion.  Group drew on Adlerian / Rogerian, narrative, MI, Patient Progress:  Invited.  Did not attend.  
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
## 2019-08-15 NOTE — Tx Team (Signed)
Interdisciplinary Treatment and Diagnostic Plan Update  08/15/2019 Time of Session: 9:00am Courtney Hansen MRN: [id number]  Principal Diagnosis: <principal problem not specified>  Secondary Diagnoses: Active Problems:   Severe recurrent major depression without psychotic features (HCC)   Current Medications:  Current Facility-Administered Medications  Medication Dose Route Frequency Provider Last Rate Last Dose  . acetaminophen (TYLENOL) tablet 650 mg  650 mg Oral Q6H PRN Rozetta Nunnery, NP      . alum & mag hydroxide-simeth (MAALOX/MYLANTA) 200-200-20 MG/5ML suspension 30 mL  30 mL Oral Q4H PRN Lindon Romp A, NP      . gabapentin (NEURONTIN) capsule 300 mg  300 mg Oral TID Lindon Romp A, NP      . hydrOXYzine (ATARAX/VISTARIL) tablet 25 mg  25 mg Oral TID PRN Rozetta Nunnery, NP      . magnesium hydroxide (MILK OF MAGNESIA) suspension 30 mL  30 mL Oral Daily PRN Lindon Romp A, NP      . mirtazapine (REMERON) tablet 15 mg  15 mg Oral QHS Lindon Romp A, NP       PTA Medications: Medications Prior to Admission  Medication Sig Dispense Refill Last Dose  . gabapentin (NEURONTIN) 300 MG capsule Take 1 capsule (300 mg total) by mouth 3 (three) times daily. 90 capsule 0 08/14/2019 at Unknown time  . hydrOXYzine (ATARAX/VISTARIL) 25 MG tablet Take 1 tablet (25 mg total) by mouth every 6 (six) hours. 12 tablet 0   . mirtazapine (REMERON) 15 MG tablet Take 1 tablet (15 mg total) by mouth at bedtime. 30 tablet 0 Unknown at Unknown time    Patient Stressors: Medication change or noncompliance  Patient Strengths: Ability for insight Active sense of humor Average or above average intelligence Supportive family/friends  Treatment Modalities: Medication Management, Group therapy, Case management,  1 to 1 session with clinician, Psychoeducation, Recreational therapy.   Physician Treatment Plan for Primary Diagnosis: <principal problem not specified> Long Term Goal(s):     Short Term Goals:     Medication Management: Evaluate patient's response, side effects, and tolerance of medication regimen.  Therapeutic Interventions: 1 to 1 sessions, Unit Group sessions and Medication administration.  Evaluation of Outcomes: Not Met  Physician Treatment Plan for Secondary Diagnosis: Active Problems:   Severe recurrent major depression without psychotic features ([place])  Long Term Goal(s):     Short Term Goals:       Medication Management: Evaluate patient's response, side effects, and tolerance of medication regimen.  Therapeutic Interventions: 1 to 1 sessions, Unit Group sessions and Medication administration.  Evaluation of Outcomes: Not Met   RN Treatment Plan for Primary Diagnosis: <principal problem not specified> Long Term Goal(s): Knowledge of disease and therapeutic regimen to maintain health will improve  Short Term Goals: Ability to participate in decision making will improve, Ability to verbalize feelings will improve, Ability to disclose and discuss suicidal ideas, Ability to identify and develop effective coping behaviors will improve and Compliance with prescribed medications will improve  Medication Management: RN will administer medications as ordered by provider, will assess and evaluate patient's response and provide education to patient for prescribed medication. RN will report any adverse and/or side effects to prescribing provider.  Therapeutic Interventions: 1 on 1 counseling sessions, Psychoeducation, Medication administration, Evaluate responses to treatment, Monitor vital signs and CBGs as ordered, Perform/monitor CIWA, COWS, AIMS and Fall Risk screenings as ordered, Perform wound care treatments as ordered.  Evaluation of Outcomes: Not Met   LCSW Treatment Plan for  Primary Diagnosis: <principal problem not specified> Long Term Goal(s): Safe transition to appropriate next level of care at discharge, Engage patient in therapeutic group addressing  interpersonal concerns.  Short Term Goals: Engage patient in aftercare planning with referrals and resources  Therapeutic Interventions: Assess for all discharge needs, 1 to 1 time with Social worker, Explore available resources and support systems, Assess for adequacy in community support network, Educate family and significant other(s) on suicide prevention, Complete Psychosocial Assessment, Interpersonal group therapy.  Evaluation of Outcomes: Not Met   Progress in Treatment: Attending groups: No. New to unit  Participating in groups: No. Taking medication as prescribed: Yes. Toleration medication: Yes. Family/Significant other contact made: No, will contact:  if patient consents to collateral contacts Patient understands diagnosis: Yes. Discussing patient identified problems/goals with staff: Yes. Medical problems stabilized or resolved: Yes. Denies suicidal/homicidal ideation: Yes. Issues/concerns per patient self-inventory: No. Other:   New problem(s) identified: None   New Short Term/Long Term Goal(s): Detox, medication stabilization, elimination of SI thoughts, development of comprehensive mental wellness plan.    Patient Goals:  "I am leaving here. I do not want to be here and I do not plan on doing anything"   Discharge Plan or Barriers: Patient recently admitted. CSW will continue to follow and assess for appropriate referrals and possible discharge planning.    Reason for Continuation of Hospitalization: Anxiety Depression Medication stabilization Suicidal ideation  Estimated Length of Stay: 3-5 days   Attendees: Patient: Courtney Hansen  08/15/2019 10:03 AM  Physician: Dr. Neita Garnet, MD 08/15/2019 10:03 AM  Nursing: Benjamine Mola.Jenetta Downer, RN 08/15/2019 10:03 AM  RN Care Manager: 08/15/2019 10:03 AM  Social Worker: Radonna Ricker, LCSW 08/15/2019 10:03 AM  Recreational Therapist:  08/15/2019 10:03 AM  Other: Harriett Sine, NP 08/15/2019 10:03 AM  Other:  08/15/2019 10:03 AM   Other: 08/15/2019 10:03 AM    Scribe for Treatment Team: Marylee Floras, [place] 08/15/2019 10:03 AM

## 2019-08-15 NOTE — Progress Notes (Signed)
   08/15/19 2318  Psych Admission Type (Psych Patients Only)  Admission Status Involuntary  Psychosocial Assessment  Patient Complaints None  Eye Contact Fair  Facial Expression Anxious  Affect Anxious;Depressed  Speech Logical/coherent  Interaction Assertive  Motor Activity Slow  Appearance/Hygiene In scrubs  Behavior Characteristics Unwilling to participate  Mood Depressed  Thought Process  Coherency WDL  Content WDL  Delusions None reported or observed  Perception WDL  Hallucination None reported or observed  Judgment Impaired  Confusion None  Danger to Self  Current suicidal ideation? Denies  Danger to Others  Danger to Others None reported or observed  pt in room refused medication and is unwilling to participate in evening wrap up group

## 2019-08-15 NOTE — Tx Team (Signed)
Initial Treatment Plan 08/15/2019 12:54 AM Courtney Hansen TAV:[id number]    PATIENT STRESSORS: Medication change or noncompliance   PATIENT STRENGTHS: Ability for insight Active sense of humor Average or above average intelligence Supportive family/friends   PATIENT IDENTIFIED PROBLEMS: Depression  SI      " To get out of here. "             DISCHARGE CRITERIA:  Improved stabilization in mood, thinking, and/or behavior  PRELIMINARY DISCHARGE PLAN: Return to previous living arrangement  PATIENT/FAMILY INVOLVEMENT: This treatment plan has been presented to and reviewed with the patient, Courtney Hansen, and/or family member.  The patient and family have been given the opportunity to ask questions and make suggestions.  Aurora Mask, RN 08/15/2019, 12:54 AM

## 2019-08-15 NOTE — Progress Notes (Signed)
DAR NOTE: Patient presents with irritable affect and mood.  Denies pain, auditory and visual hallucinations.  Maintained on routine safety checks.  Refused prescribed medications after several attempts.  Provider made aware.    Support and encouragement offered as needed.  States goal for today is "discharge."  Patient is withdrawn and isolates to her room for majority of this shift.  Patient is safe on the unit.

## 2019-08-15 NOTE — Progress Notes (Signed)
Patient 43 years old,  admitted involuntary for attempting to leave the hospital after going to ED c/o increased depression with suicidal  thoughts to hang herself or jump off a bridge. She has superficial cuts to both wrist and reports that she had been cutting this week. She reports multiple attempts in the past and  feels that her medications are no longer working for her. She reports that she drinks alcohol on the weekends and uses thc occassionally. Patient was cooperative during admission and was oriented to unit. Safety maintained with 15 min checks.

## 2019-08-15 NOTE — [facility] Group Notes (Addendum)
LCSW Group Therapy Note 08/15/2019 1:50 PM  Type of Therapy and Topic: Group Therapy: Overcoming Obstacles  Participation Level: Did Not Attend  Description of Group:  In this group patients will be encouraged to explore what they see as obstacles to their own wellness and recovery. They will be guided to discuss their thoughts, feelings, and behaviors related to these obstacles. The group will process together ways to cope with barriers, with attention given to specific choices patients can make. Each patient will be challenged to identify changes they are motivated to make in order to overcome their obstacles. This group will be process-oriented, with patients participating in exploration of their own experiences as well as giving and receiving support and challenge from other group members.  Therapeutic Goals: 1. Patient will identify personal and current obstacles as they relate to admission. 2. Patient will identify barriers that currently interfere with their wellness or overcoming obstacles.  3. Patient will identify feelings, thought process and behaviors related to these barriers. 4. Patient will identify two changes they are willing to make to overcome these obstacles:   Summary of Patient Progress  Invited, chose not to attend. Patient was asleep in their room.    Therapeutic Modalities:  Cognitive Behavioral Therapy Solution Focused Therapy Motivational Interviewing Relapse Prevention Therapy   Theresa Duty Clinical Social Worker
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
## 2019-08-15 NOTE — Progress Notes (Signed)
Recreation Therapy Notes  Date:  12.7.20 Time: 0930 Location: 300 [address] Dayroom  Group Topic: Stress Management  Goal Area(s) Addresses:  Patient will identify positive stress management techniques. Patient will identify benefits of using stress management post d/c.  Intervention: Stress Management  Activity :  Guided Imagery.  LRT read Hansen script that guided patients [place]  Patients were to listen and follow along as LRT read script to engage in activity.    Education:  Stress Management, Discharge Planning.   Education Outcome: Acknowledges Education  Clinical Observations/Feedback: Pt did not attend activity.    Courtney Hansen, LRT/CTRS         Courtney Hansen 08/15/2019 12:11 PM 

## 2019-08-16 MED ORDER — MIRTAZAPINE 30 MG PO TABS
30.0000 mg | ORAL_TABLET | Freq: Every day | ORAL | Status: DC
  Filled 2019-08-16 (×2): qty 1
  Filled 2019-08-16: qty 7

## 2019-08-16 NOTE — Progress Notes (Signed)
Recreation Therapy Notes  Animal-Assisted Activity (AAA) Program Checklist/Progress Notes Patient Eligibility Criteria Checklist & Daily Group note for Rec Tx Intervention  Date: 12.8.20 Time: 1430 Location: 300 [address] Dayroom   AAA/T Program Assumption of Risk Form signed by Patient/ or Parent Legal Guardian  YES   Patient is free of allergies or sever asthma YES   Patient reports no fear of animals YES   Patient reports no history of cruelty to animals YES   Patient understands his/her participation is voluntary YES   Patient washes hands before animal contact YES   Patient washes hands after animal contact YES   Education: Hand Washing, Appropriate Animal Interaction   Education Outcome: Acknowledges understanding/In group clarification offered/Needs additional education.   Clinical Observations/Feedback:  Pt did not attend group activity.    Courtney Hansen, LRT/CTRS         Courtney Hansen 08/16/2019 3:41 PM 

## 2019-08-16 NOTE — [facility] Group Notes (Signed)
Pt did not attend wrap up group this evening. Pt was in bed.  

## 2019-08-16 NOTE — Progress Notes (Signed)
[facility] MD Progress Note  08/16/2019 12:22 PM Courtney Hansen  MRN:  [id number] Subjective: Patient reports she is feeling better.  Today denies suicidal ideations. Objective: I have discussed case with treatment team and I met with patient. 43 year old female, presented on 12/6 voluntarily reporting worsening depression, neurovegetative symptoms, anxiety, suicidal thoughts, recent episode of self cutting on forearms.  In addition to depression reports a history of PTSD.  Attributed decompensation in part to home related stressors (lives with mother whom she reports "yells all the time, screams, makes the PTSD worse".  She is known to our unit from a prior admission in November/2019 at which time presented for depression and suicidal thoughts, was diagnosed with MDD.  Admission BAL 130.  Today patient reports feeling better than on admission.  Currently denies suicidal ideations.  Denies self-injurious ideations or thoughts of self cutting.  Reports improving sleep. Behavior on unit in good control, no disruptive or agitated behaviors, polite on approach. Limited milieu participation, keeps to self . Of note, patient had refused psychiatric medication yesterday.  We reviewed this today.  She reports she is feeling better and therefore was not sure she needed the medications.  She was prescribed Remeron prior to admission.  Denies side effects.  Was started on low-dose Effexor XR yesterday.  At this time reports she prefers to continue Remeron as monotherapy. She is not currently presenting with symptoms of alcohol withdrawal.  No tremors, no diaphoresis, presents calm and in no acute distress .   Principal Problem:MDD, PTSD by history, Alcohol Use Disorder by History  Diagnosis: Active Problems:   Severe recurrent major depression without psychotic features ([place])  Total Time spent with patient: 20 minutes  Past Psychiatric History:   Past Medical History:  Past Medical History:  Diagnosis Date  .  Depression   . PTSD (post-traumatic stress disorder)    History reviewed. No pertinent surgical history. Family History:  Family History  Problem Relation Age of Onset  . Schizophrenia Brother   . Depression Brother    Family Psychiatric  History:  Social History:  Social History   Substance and Sexual Activity  Alcohol Use Yes   Comment: occasional drinker     Social History   Substance and Sexual Activity  Drug Use Yes  . Types: Marijuana, Methamphetamines    Social History   Socioeconomic History  . Marital status: Divorced    Spouse name: Not on file  . Number of children: Not on file  . Years of education: Not on file  . Highest education level: Some college, no degree  Occupational History  . Not on file  Social Needs  . Financial resource strain: Patient refused  . Food insecurity    Worry: Patient refused    Inability: Patient refused  . Transportation needs    Medical: Patient refused    Non-medical: Patient refused  Tobacco Use  . Smoking status: Current Every Day Smoker    Packs/day: 1.00    Types: Cigarettes  . Smokeless tobacco: Never Used  . Tobacco comment: Pt declined counsel  Substance and Sexual Activity  . Alcohol use: Yes    Comment: occasional drinker  . Drug use: Yes    Types: Marijuana, Methamphetamines  . Sexual activity: Yes    Birth control/protection: None  Lifestyle  . Physical activity    Days per week: Patient refused    Minutes per session: Patient refused  . Stress: Not on file  Relationships  . Social connections  Talks on phone: Patient refused    Gets together: Patient refused    Attends religious service: Patient refused    Active member of club or organization: Patient refused    Attends meetings of clubs or organizations: Patient refused    Relationship status: Patient refused  Other Topics Concern  . Not on file  Social History Narrative  . Not on file   Additional Social History:   Sleep:  Good  Appetite:  Good  Current Medications: Current Facility-Administered Medications  Medication Dose Route Frequency Provider Last Rate Last Dose  . acetaminophen (TYLENOL) tablet 650 mg  650 mg Oral Q6H PRN Rozetta Nunnery, NP      . alum & mag hydroxide-simeth (MAALOX/MYLANTA) 200-200-20 MG/5ML suspension 30 mL  30 mL Oral Q4H PRN Lindon Romp A, NP      . gabapentin (NEURONTIN) capsule 300 mg  300 mg Oral TID Cobos, Myer Peer, MD      . hydrOXYzine (ATARAX/VISTARIL) tablet 25 mg  25 mg Oral Q6H PRN Cobos, Myer Peer, MD      . LORazepam (ATIVAN) tablet 1 mg  1 mg Oral Q6H PRN Cobos, Myer Peer, MD      . magnesium hydroxide (MILK OF MAGNESIA) suspension 30 mL  30 mL Oral Daily PRN Lindon Romp A, NP      . mirtazapine (REMERON) tablet 30 mg  30 mg Oral QHS Cobos, Myer Peer, MD      . multivitamin with minerals tablet 1 tablet  1 tablet Oral Daily Cobos, Myer Peer, MD      . thiamine (B-1) injection 100 mg  100 mg Intramuscular Once Cobos, Myer Peer, MD      . thiamine (VITAMIN B-1) tablet 100 mg  100 mg Oral Daily Cobos, Myer Peer, MD        Lab Results: No results found for this or any previous visit (from the past 48 hour(s)).  Blood Alcohol level:  Lab Results  Component Value Date   ETH 130 (H) 08/14/2019   ETH <10 99/35/7017    Metabolic Disorder Labs: No results found for: HGBA1C, MPG No results found for: PROLACTIN No results found for: CHOL, TRIG, HDL, CHOLHDL, VLDL, LDLCALC  Physical Findings: AIMS: Facial and Oral Movements Muscles of Facial Expression: None, normal Lips and Perioral Area: None, normal Jaw: None, normal Tongue: None, normal,Extremity Movements Upper (arms, wrists, hands, fingers): None, normal Lower (legs, knees, ankles, toes): None, normal, Trunk Movements Neck, shoulders, hips: None, normal, Overall Severity Severity of abnormal movements (highest score from questions above): None, normal Incapacitation due to abnormal movements: None,  normal Patient's awareness of abnormal movements (rate only patient's report): No Awareness, Dental Status Current problems with teeth and/or dentures?: No Does patient usually wear dentures?: No  CIWA:  CIWA-Ar Total: 0 COWS:     Musculoskeletal: Strength & Muscle Tone: within normal limits no tremors, no diaphoresis, no restlessness Gait & Station: normal Patient leans: N/A  Psychiatric Specialty Exam: Physical Exam  ROS denies headache, no chest pain, no shortness of breath, no vomiting  Blood pressure 124/85, pulse 93, temperature 98 F (36.7 C), temperature source Oral, resp. rate 16, height 4' 5" (1.346 m), weight 43.1 kg, last menstrual period 08/09/2019.Body mass index is 23.78 kg/m.  Repeat BP 105/61  General Appearance: Improving grooming  Eye Contact:  Good  Speech:  Normal Rate  Volume:  Normal  Mood:  Reports feeling better today  Affect:  Appropriate, vaguely anxious,  Thought Process:  Linear and Descriptions of Associations: Intact  Orientation:  Full (Time, Place, and Person)  Thought Content:  no hallucinations, no delusions   Suicidal Thoughts:  No denies suicidal or self injurious ideations, denies homicidal or violent ideations  Homicidal Thoughts:  No  Memory:  recent and remote grossly intact   Judgement:  Other:  improving  Insight:  improving  Psychomotor Activity:  Normal- no agitation or restlessness   Concentration:  Concentration: Good and Attention Span: Good  Recall:  Good  Fund of Knowledge:  Good  Language:  Good  Akathisia:  Negative  Handed:  Right  AIMS (if indicated):     Assets:  Desire for Improvement Resilience  ADL's:  Intact  Cognition:  WNL  Sleep:  Number of Hours: 6.75   Assessment -  43 year old female, presented on 12/6 voluntarily reporting worsening depression, neurovegetative symptoms, anxiety, suicidal thoughts, recent episode of self cutting on forearms.  In addition to depression reports a history of PTSD.   Attributed decompensation in part to home related stressors (lives with mother whom she reports "yells all the time, screams, makes the PTSD worse".  She is known to our unit from a prior admission in November/2019 at which time presented for depression and suicidal thoughts, was diagnosed with MDD.  Admission BAL 130.  Today patient reports feeling better and denies suicidal or self injurious ideations. Behavior in good control. Not currently presenting with significant alcohol WDL symptoms and vitals are stable. Denies medication side effects but prefers to continue Remeron as monotherapy at this time, so will discontinue Effexor XR , which was started yesterday ( was taking Remeron prior to admission without side effects).  Treatment Plan Summary: Daily contact with patient to assess and evaluate symptoms and progress in treatment, Medication management, Plan inpatient treatment and medications as below  Encourage group and milieu participation Encourage efforts to work on sobriety Continue Remeron at 30 mgrs QHS for depression, anxiety, insomnia Discontinue Effexor XR  Continue Neurontin 300 mgrs TID for anxiety, pain Continue Ativan 1 mgr Q 6 hours PRN for alcohol WDL as needed  Treatment team working on disposition planning options Jenne Campus, MD 08/16/2019, 12:22 PM
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
## 2019-08-16 NOTE — Progress Notes (Signed)
DAR NOTE: Patient presents with anxious affect and depressed mood.  Denies suicidal thoughts, pain, auditory and visual hallucinations.  Described energy level as normal and concentration as good.  Rates depression at 5, hopelessness at 5, and anxiety at 0.  Maintained on routine safety checks.  Medications given as prescribed.  Support and encouragement offered as needed.  Refused to attend groups after several attempts.  Patient is withdrawn and isolates to her room for majority of this shift.  States goal for today is "take med at night."  Patient is safe on and off the unit. Offered no complaint.

## 2019-08-16 NOTE — BHH Suicide Risk Assessment (Signed)
[employer] INPATIENT:  Family/Significant Other Suicide Prevention Education  Suicide Prevention Education:  Patient Refusal for Family/Significant Other Suicide Prevention Education: The patient Courtney Hansen has refused to provide written consent for family/significant other to be provided Family/Significant Other Suicide Prevention Education during admission and/or prior to discharge.  Physician notified.  SPE completed with patient, as patient refused to consent to family contact. SPI pamphlet provided to pt and pt was encouraged to share information with support network, ask questions, and talk about any concerns relating to SPE. Patient denies access to guns/firearms and verbalized understanding of information provided. Mobile Crisis information also provided to patient.    Marylee Floras 08/16/2019, 10:21 AM

## 2019-08-16 NOTE — Progress Notes (Signed)
Patient ID: Courtney Hansen, female   DOB: 1975/09/20, 43 y.o.   MRN: [id number]   Pt was informed by that group with the [facility] was about to begin. Pt chose not to come to group.

## 2019-08-17 MED ORDER — HYDROXYZINE HCL 25 MG PO TABS: 25.0000 mg | ORAL_TABLET | Freq: Four times a day (QID) | ORAL | 0 refills | Status: DC | PRN

## 2019-08-17 MED ORDER — MIRTAZAPINE 30 MG PO TABS: 30.0000 mg | ORAL_TABLET | Freq: Every day | ORAL | 0 refills | Status: DC

## 2019-08-17 MED ORDER — GABAPENTIN 300 MG PO CAPS: 300.0000 mg | ORAL_CAPSULE | Freq: Three times a day (TID) | ORAL | 0 refills | Status: DC

## 2019-08-17 NOTE — Progress Notes (Addendum)
D:  Patient's self inventory sheet, patient sleeps good, no sleep medication.  Good appetite, normal energy level, good concentration.  Rated depression, hopeless and anxiety 4.  Denied withdrawals.  Denied SI.  Denied physical problems.  Denied physical pain.  Goal is contact family services.  Does have discharge plans. A:  Patient has refused all medications ordered by MD.  Emotional support and encouragement given patient. R:  Denied SI and HI, contracts for safety.  Denied A/V hallucinations.  Safety maintained with 15 minute checks.

## 2019-08-17 NOTE — Progress Notes (Signed)
  [facility] Adult Case Management Discharge Plan :  Will you be returning to the same living situation after discharge:  Yes,  patient is returning home with her mother At discharge, do you have transportation home?: Yes,  patient reports a close friend is picking her up  Do you have the ability to pay for your medications: No.  Release of information consent forms completed and in the chart;  Patient's signature needed at discharge.  Patient to Follow up at: Follow-up Information    [facility] on 08/23/2019.   Specialty: Professional Counselor Why: Your next appointment for medication management is Tuesday,08/23/19 at 3:30pm with  Leslie. Please inquire about your next therapy appointment during this visit. For any additional questions, please call the agency's number.  Contact information: [facility] [place] [id number] [phone]           Next level of care provider has access to [place] and Suicide Prevention discussed: Yes,  with the patient   Have you used any form of tobacco in the last 30 days? (Cigarettes, Smokeless Tobacco, Cigars, and/or Pipes): Yes  Has patient been referred to the Quitline?: Patient refused referral  Patient has been referred for addiction treatment: N/A  Marylee Floras, [place] 08/17/2019, 10:09 AM

## 2019-08-17 NOTE — Progress Notes (Signed)
   08/17/19 0008  Psych Admission Type (Psych Patients Only)  Admission Status Involuntary  Psychosocial Assessment  Patient Complaints None  Eye Contact Fair  Facial Expression Anxious  Affect Anxious;Depressed  Speech Logical/coherent  Interaction Assertive  Motor Activity Slow  Appearance/Hygiene In scrubs  Behavior Characteristics Cooperative  Mood Depressed  Thought Process  Coherency WDL  Content WDL  Delusions None reported or observed  Perception WDL  Hallucination None reported or observed  Judgment Impaired  Confusion None  Danger to Self  Current suicidal ideation? Denies  Danger to Others  Danger to Others None reported or observed  D: Patient in dayroom on approach. Pt reports she is staying in bed to pass time.  A: Support and encouragement provided as needed.  R: Patient remains safe on the unit. Will continue to monitor for safety and stability.

## 2019-08-17 NOTE — [facility] Suicide Risk Assessment (Signed)
[facility] Discharge Suicide Risk Assessment   Principal Problem: Depression  Discharge Diagnoses: Active Problems:   Severe recurrent major depression without psychotic features ([place])   Total Time spent with patient: 30 minutes  Musculoskeletal: Strength & Muscle Tone: within normal limits Gait & Station: normal Patient leans: N/A  Psychiatric Specialty Exam: ROS no headache, no chest pain, no shortness of breath, no vomiting   Blood pressure 112/86, pulse 75, temperature 98 F (36.7 C), temperature source Oral, resp. rate 16, height 4\' 5"  (1.346 m), weight 43.1 kg, last menstrual period 08/09/2019.Body mass index is 23.78 kg/m.  General Appearance: Well Groomed  Eye Contact::  Good  Speech:  Normal Rate409  Volume:  Normal  Mood:  reports feeling better, improved mood   Affect:  Appropriate and more reactive  Thought Process:  Linear and Descriptions of Associations: Intact  Orientation:  Full (Time, Place, and Person)  Thought Content:  no hallucinations, no delusions, not internally preoccupied   Suicidal Thoughts:  No denies suicidal or self injurious ideations, contracts for safety on unit   Homicidal Thoughts:  No  Memory:  recent and remote grossly intact   Judgement:  Other:  improving   Insight:  Fair/ improving   Psychomotor Activity:  Normal- no restlessness or agitation  Concentration:  Good  Recall:  Good  Fund of Knowledge:Good  Language: Good  Akathisia:  Negative  Handed:  Right  AIMS (if indicated):     Assets:  Communication Skills Desire for Improvement Resilience  Sleep:  Number of Hours: 5.75  Cognition: WNL  ADL's:  Intact   Mental Status Per Nursing Assessment::   On Admission:  Suicidal ideation indicated by patient, Self-harm behaviors  Demographic Factors:  30 y old female   Loss Factors: Alcohol intoxication. Strained relationship with mother  Historical Factors: History of Depression and  PTSD , Alcohol Use Disorder by history  Risk  Reduction Factors:   Sense of responsibility to family, Living with another person, especially a relative and Positive coping skills or problem solving skills  Continued Clinical Symptoms:  Currently alert, attentive, well related, calm, pleasant on approach, mood improved, affect reactive and full in range , no thought disorder, no suicidal or self injurious ideations, no homicidal or violent ideations, future oriented. No current symptoms of alcohol WDL- vitals stable Behavior on unit in good control/calm and pleasant on approach. Limited  interaction with peers  No medication side effects reported .    Cognitive Features That Contribute To Risk:  No gross cognitive deficits noted upon discharge. Is alert , attentive, and oriented x 3     Suicide Risk:  Mild:  Suicidal ideation of limited frequency, intensity, duration, and specificity.  There are no identifiable plans, no associated intent, mild dysphoria and related symptoms, good self-control (both objective and subjective assessment), few other risk factors, and identifiable protective factors, including available and accessible social support.  Follow-up Information    [facility] on 08/23/2019.   Specialty: Professional Counselor Why: Your next appointment for medication management is Tuesday,08/23/19 at 3:30pm with  G. L. Garcia. Please inquire about your next therapy appointment during this visit. For any additional questions, please call the agency's number.  Contact information: [facility] [place] [address] [phone]           Plan Of Care/Follow-up recommendations:  Activity:  as tolerated Diet:  regular Tests:  NA Other:  See below  Patient  is expressing readiness for discharge and is leaving unit in good spirits  Plans to return home Plans to follow up as above   Craige Cotta, MD 08/17/2019, 1:18 PM
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
## 2019-08-17 NOTE — Progress Notes (Addendum)
Discharge Note:  Patient discharged home with family member.  Patient denied SI and HI.  Denied A/V hallucinations.  Suicide prevention information given and discussed with patient who stated she understood and had no questions.  Patient stated she received all her belongings, clothing, toiletries, misc items, etc.  Patient stated she  appreciated all assistance received from [facility] staff.  All required discharge information given to patient at discharge.  

## 2019-08-17 NOTE — Progress Notes (Signed)
PATIENT REFUSED ALL MEDICATIONS THIS MORNING.   Patient continues to lay in bed.

## 2020-01-23 ENCOUNTER — Emergency Department (HOSPITAL_COMMUNITY): Payer: No Typology Code available for payment source

## 2020-01-23 ENCOUNTER — Encounter (HOSPITAL_COMMUNITY): Payer: Self-pay | Admitting: Emergency Medicine

## 2020-01-23 ENCOUNTER — Other Ambulatory Visit: Payer: Self-pay

## 2020-01-23 ENCOUNTER — Emergency Department (HOSPITAL_COMMUNITY)
Admission: EM | Admit: 2020-01-23 | Discharge: 2020-01-24 | Disposition: A | Discharge status: Home / Self Care | Payer: No Typology Code available for payment source | Attending: Emergency Medicine | Admitting: Emergency Medicine

## 2020-01-23 DIAGNOSIS — R0602 Shortness of breath: Secondary | ICD-10-CM | POA: Insufficient documentation

## 2020-01-23 DIAGNOSIS — Z5321 Procedure and treatment not carried out due to patient leaving prior to being seen by health care provider: Secondary | ICD-10-CM | POA: Insufficient documentation

## 2020-01-23 LAB — CBC
HCT: 37.3 % (ref 36.0–46.0)
Hemoglobin: 12.2 g/dL (ref 12.0–15.0)
MCH: 32.5 pg (ref 26.0–34.0)
MCHC: 32.7 g/dL (ref 30.0–36.0)
MCV: 99.5 fL (ref 80.0–100.0)
Platelets: 226 10*3/uL (ref 150–400)
RBC: 3.75 MIL/uL — ABNORMAL LOW (ref 3.87–5.11)
RDW: 14.1 % (ref 11.5–15.5)
WBC: 9.3 10*3/uL (ref 4.0–10.5)
nRBC: 0 % (ref 0.0–0.2)

## 2020-01-23 LAB — BASIC METABOLIC PANEL
Anion gap: 13 (ref 5–15)
BUN: 6 mg/dL (ref 6–20)
CO2: 20 mmol/L — ABNORMAL LOW (ref 22–32)
Calcium: 9.1 mg/dL (ref 8.9–10.3)
Chloride: 99 mmol/L (ref 98–111)
Creatinine, Ser: 0.59 mg/dL (ref 0.44–1.00)
GFR calc Af Amer: >60 mL/min (ref >60)
GFR calc non Af Amer: >60 mL/min (ref >60)
Glucose, Bld: 80 mg/dL (ref 70–99)
Potassium: 4.1 mmol/L (ref 3.5–5.1)
Sodium: 132 mmol/L — ABNORMAL LOW (ref 135–145)

## 2020-01-23 LAB — TROPONIN I (HIGH SENSITIVITY): Troponin I (High Sensitivity): 5 ng/L (ref <18)

## 2020-01-23 LAB — I-STAT BETA HCG BLOOD, ED (MC, WL, AP ONLY): I-stat hCG, quantitative: <5 m[IU]/mL (ref <5)

## 2020-01-23 MED ORDER — SODIUM CHLORIDE 0.9% FLUSH: 3.0000 mL | Freq: Once | INTRAVENOUS | Status: DC

## 2020-01-23 NOTE — ED Triage Notes (Signed)
Patient with increased shortness of breath and chest pain.  Patient states that she is having lung pain.  She states that she has needed some breathing treatments that have helped.  Patient states she has taken some puffs off her HFA which did help.  She states that she needs something more to help for the shortness of breath.

## 2020-01-24 LAB — TROPONIN I (HIGH SENSITIVITY): Troponin I (High Sensitivity): 6 ng/L (ref <18)

## 2020-01-24 NOTE — ED Notes (Signed)
Patient states she has a doctors appointment in the morning and she is just going to go to her appointment because the symptoms come and go but she just needs rest to feel better for now since they wont give her any medicine.

## 2020-09-20 ENCOUNTER — Emergency Department (HOSPITAL_COMMUNITY)
Admission: EM | Admit: 2020-09-20 | Discharge: 2020-09-20 | Disposition: A | Discharge status: Home / Self Care | Payer: No Typology Code available for payment source | Attending: Emergency Medicine | Admitting: Emergency Medicine

## 2020-09-20 ENCOUNTER — Other Ambulatory Visit: Payer: Self-pay

## 2020-09-20 ENCOUNTER — Encounter (HOSPITAL_COMMUNITY): Payer: Self-pay | Admitting: Emergency Medicine

## 2020-09-20 DIAGNOSIS — U071 COVID-19: Secondary | ICD-10-CM | POA: Insufficient documentation

## 2020-09-20 DIAGNOSIS — Z5321 Procedure and treatment not carried out due to patient leaving prior to being seen by health care provider: Secondary | ICD-10-CM | POA: Insufficient documentation

## 2020-09-20 NOTE — ED Notes (Signed)
Pt left told security

## 2020-09-20 NOTE — ED Triage Notes (Signed)
Patient complains of shortness of breath, tested postive for COVID on Saturday. SpO2 100% on room air.

## 2021-10-28 ENCOUNTER — Encounter (HOSPITAL_COMMUNITY): Payer: Self-pay

## 2021-10-28 ENCOUNTER — Other Ambulatory Visit: Payer: Self-pay

## 2021-10-28 ENCOUNTER — Emergency Department (HOSPITAL_COMMUNITY)
Admission: EM | Admit: 2021-10-28 | Discharge: 2021-10-29 | Disposition: A | Discharge status: Home / Self Care | Payer: No Typology Code available for payment source | Attending: Emergency Medicine | Admitting: Emergency Medicine

## 2021-10-28 DIAGNOSIS — X789XXA Intentional self-harm by unspecified sharp object, initial encounter: Secondary | ICD-10-CM | POA: Insufficient documentation

## 2021-10-28 DIAGNOSIS — S61511A Laceration without foreign body of right wrist, initial encounter: Secondary | ICD-10-CM | POA: Insufficient documentation

## 2021-10-28 DIAGNOSIS — F4310 Post-traumatic stress disorder, unspecified: Secondary | ICD-10-CM | POA: Insufficient documentation

## 2021-10-28 DIAGNOSIS — F17210 Nicotine dependence, cigarettes, uncomplicated: Secondary | ICD-10-CM | POA: Insufficient documentation

## 2021-10-28 DIAGNOSIS — S61512A Laceration without foreign body of left wrist, initial encounter: Secondary | ICD-10-CM | POA: Insufficient documentation

## 2021-10-28 DIAGNOSIS — T1491XA Suicide attempt, initial encounter: Secondary | ICD-10-CM | POA: Insufficient documentation

## 2021-10-28 LAB — RAPID URINE DRUG SCREEN, HOSP PERFORMED
Amphetamines: NOT DETECTED
Barbiturates: NOT DETECTED
Benzodiazepines: POSITIVE — AB
Cocaine: NOT DETECTED
Opiates: NOT DETECTED
Tetrahydrocannabinol: POSITIVE — AB

## 2021-10-28 LAB — CBC WITH DIFFERENTIAL/PLATELET
Abs Immature Granulocytes: 0.02 10*3/uL (ref 0.00–0.07)
Basophils Absolute: 0.1 10*3/uL (ref 0.0–0.1)
Basophils Relative: 1 %
Eosinophils Absolute: 0.1 10*3/uL (ref 0.0–0.5)
Eosinophils Relative: 2 %
HCT: 38.6 % (ref 36.0–46.0)
Hemoglobin: 13.2 g/dL (ref 12.0–15.0)
Immature Granulocytes: 0 %
Lymphocytes Relative: 43 %
Lymphs Abs: 2.6 10*3/uL (ref 0.7–4.0)
MCH: 34.3 pg — ABNORMAL HIGH (ref 26.0–34.0)
MCHC: 34.2 g/dL (ref 30.0–36.0)
MCV: 100.3 fL — ABNORMAL HIGH (ref 80.0–100.0)
Monocytes Absolute: 0.6 10*3/uL (ref 0.1–1.0)
Monocytes Relative: 9 %
Neutro Abs: 2.7 10*3/uL (ref 1.7–7.7)
Neutrophils Relative %: 45 %
Platelets: 246 10*3/uL (ref 150–400)
RBC: 3.85 MIL/uL — ABNORMAL LOW (ref 3.87–5.11)
RDW: 12.8 % (ref 11.5–15.5)
WBC: 6.1 10*3/uL (ref 4.0–10.5)
nRBC: 0 % (ref 0.0–0.2)

## 2021-10-28 LAB — COMPREHENSIVE METABOLIC PANEL
ALT: 20 U/L (ref 0–44)
AST: 27 U/L (ref 15–41)
Albumin: 4.7 g/dL (ref 3.5–5.0)
Alkaline Phosphatase: 54 U/L (ref 38–126)
Anion gap: 10 (ref 5–15)
BUN: <5 mg/dL — ABNORMAL LOW (ref 6–20)
CO2: 25 mmol/L (ref 22–32)
Calcium: 8.9 mg/dL (ref 8.9–10.3)
Chloride: 106 mmol/L (ref 98–111)
Creatinine, Ser: 0.57 mg/dL (ref 0.44–1.00)
GFR, Estimated: >60 mL/min (ref >60)
Glucose, Bld: 94 mg/dL (ref 70–99)
Potassium: 3.6 mmol/L (ref 3.5–5.1)
Sodium: 141 mmol/L (ref 135–145)
Total Bilirubin: 0.3 mg/dL (ref 0.3–1.2)
Total Protein: 6.9 g/dL (ref 6.5–8.1)

## 2021-10-28 LAB — ETHANOL: Alcohol, Ethyl (B): 232 mg/dL — ABNORMAL HIGH (ref <10)

## 2021-10-28 LAB — I-STAT BETA HCG BLOOD, ED (MC, WL, AP ONLY): I-stat hCG, quantitative: <5 m[IU]/mL (ref <5)

## 2021-10-28 MED ORDER — LORAZEPAM 2 MG/ML IJ SOLN: 0.0000 mg | Freq: Four times a day (QID) | INTRAMUSCULAR | Status: DC

## 2021-10-28 MED ORDER — LORAZEPAM 1 MG PO TABS: 0.0000 mg | ORAL_TABLET | Freq: Four times a day (QID) | ORAL | Status: DC

## 2021-10-28 MED ORDER — THIAMINE HCL 100 MG PO TABS: 100.0000 mg | ORAL_TABLET | Freq: Every day | ORAL | Status: DC

## 2021-10-28 MED ORDER — LORAZEPAM 1 MG PO TABS: 0.0000 mg | ORAL_TABLET | Freq: Two times a day (BID) | ORAL | Status: DC

## 2021-10-28 MED ORDER — LORAZEPAM 1 MG PO TABS
1.0000 mg | ORAL_TABLET | Freq: Once | ORAL | Status: AC
  Administered 2021-10-28: 1 mg via ORAL
  Filled 2021-10-28: qty 1

## 2021-10-28 MED ORDER — THIAMINE HCL 100 MG/ML IJ SOLN: 100.0000 mg | Freq: Every day | INTRAMUSCULAR | Status: DC

## 2021-10-28 MED ORDER — LORAZEPAM 2 MG/ML IJ SOLN: 0.0000 mg | Freq: Two times a day (BID) | INTRAMUSCULAR | Status: DC

## 2021-10-28 NOTE — ED Notes (Signed)
Received verbal report from Paige P RN at this time 

## 2021-10-28 NOTE — ED Notes (Signed)
TTS at bedside. 

## 2021-10-28 NOTE — ED Triage Notes (Signed)
Pt came in POV from home d/t PTSD that has her feeling SI & HI, has been cutting on herself for a few weeks. States her medicine has not been working & she really wants help.

## 2021-10-28 NOTE — ED Notes (Signed)
Pt's belongings has been inventoried & in locker #10, she is dressed out in burgundy scrubs.

## 2021-10-28 NOTE — ED Notes (Signed)
Security at bedside to wand pt. 

## 2021-10-28 NOTE — ED Notes (Signed)
Pt had multiple small lacerations on bilat arm. Per pt from here cutting herself. Pt is requesting to use the phone at this time to call her significant other in reference to her job.

## 2021-10-28 NOTE — [facility] Counselor (Signed)
TTS attempted to see patient but the tele-machine had no sound, they could hear TTS but TTS could not hear them.

## 2021-10-28 NOTE — ED Notes (Signed)
TTS in process 

## 2021-10-28 NOTE — BH Assessment (Signed)
Comprehensive Clinical Assessment (CCA) Note  10/29/2021 TAMIQUA STUP [id number]    Disposition:  Margorie John, PA recommended that patient is discharged home with resources.   The patient demonstrates the following risk factors for suicide: Chronic risk factors for suicide include: substance use disorder, previous suicide attempts via attempted to OD on pills, and previous self-harm via cutting . Acute risk factors for suicide include:  no support other than boyfriend . Protective factors for this patient include: life satisfaction. Considering these factors, the overall suicide risk at this point appears to be low. Patient is appropriate for outpatient follow up.   [facility] ED from 10/28/2021 in [place] Most recent reading at 10/29/2021  1:01 AM Admission (Discharged) from 08/14/2019 in [facility] Most recent reading at 08/15/2019 12:29 AM ED from 08/14/2019 in [facility] DEPT Most recent reading at 08/14/2019 10:34 AM  C-SSRS RISK CATEGORY Error: Q3, 4, or 5 should not be populated when Q2 is No High Risk High Risk         Patient is a 46 year old female reporting via the bus to the ED due to PTSD and cutting. Patient reports she did not cut herself as a suicide attempt but reports she has been crying and her mind has been all over the place. Pt reports superficial lacerations. Patient denied SI/HI/AVH/. Patient denied legal issues and access to weapons. Pt reports a hx of SI and  hx in the past of suicide attempt via OD. Patient reports an Inpatient stay at [facility] in 2020 due to losing a close friend and reports having issues with coping at that time.  Patient reports she is able to contract for safety and reports her boyfriend is her support system.   Dispositioned with Margorie John, PA recommended that patient is discharged home with resources.   Chief Complaint:  Chief Complaint   Patient presents with   PTSD   Visit Diagnosis: PTSD     CCA Screening, Triage and Referral (STR)  Patient Reported Information How did you hear about [place]? Self  What Is the Reason for Your Visit/Call Today? Pt reports catching the bus to the ED due to cutting and PTSD>  How Long Has This Been Causing You Problems? 1 wk - 1 month  What Do You Feel Would Help You the Most Today? Treatment for Depression or other mood problem   Have You Recently Had Any Thoughts About Hurting Yourself? No  Are You Planning to Commit Suicide/Harm Yourself At This time? No   Have you Recently Had Thoughts About [place]? No  Are You Planning to Harm Someone at This Time? No  Explanation: No data recorded  Have You Used Any Alcohol or Drugs in the Past 24 Hours? No  How Long Ago Did You Use Drugs or Alcohol? No data recorded What Did You Use and How Much? No data recorded  Do You Currently Have a Therapist/Psychiatrist? No  Name of Therapist/Psychiatrist: No data recorded  Have You Been Recently Discharged From Any Office Practice or Programs? No  Explanation of Discharge From Practice/Program: No data recorded    CCA Screening Triage Referral Assessment Type of Contact: Tele-Assessment  Telemedicine Service Delivery:   Is this Initial or Reassessment? Initial Assessment  Date Telepsych consult ordered in [facility]:  10/28/21  Time Telepsych consult ordered in [facility]:  0842  Location of Assessment: [facility] ED  Provider Location: [facility] Assessment Services  Collateral Involvement: none   Does Patient Have a Stage manager Guardian? No data recorded Name and Contact of Legal Guardian: No data recorded If Minor and Not Living with Parent(s), Who has Custody? No data recorded Is CPS involved or ever been involved? Never  Is APS involved or ever been involved? Never   Patient Determined To Be At Risk for Harm To Self or Others Based on Review of Patient Reported  Information or Presenting Complaint? No  Method: No data recorded Availability of Means: No data recorded Intent: No data recorded Notification Required: No data recorded Additional Information for Danger to Others Potential: No data recorded Additional Comments for Danger to Others Potential: No data recorded Are There Guns or Other Weapons in Your Home? No data recorded Types of Guns/Weapons: No data recorded Are These Weapons Safely Secured?                            No data recorded Who Could Verify You Are Able To Have These Secured: No data recorded Do You Have any Outstanding Charges, Pending Court Dates, Parole/Probation? No data recorded Contacted To Inform of Risk of Harm To Self or Others: -- (n/a)    Does Patient Present under Involuntary Commitment? No  IVC Papers Initial File Date: No data recorded  [place] of Residence: [place]   Patient Currently Receiving the Following Services: -- (none)   Determination of Need: Routine (7 days)   Options For Referral: Intensive Outpatient Therapy; Partial Hospitalization     CCA Biopsychosocial Patient Reported Schizophrenia/Schizoaffective Diagnosis in Past: No   Strengths: Pt reports being resilant   Mental Health Symptoms Depression:   Change in energy/activity   Duration of Depressive symptoms:  Duration of Depressive Symptoms: Greater than two weeks   Mania:   None   Anxiety:    Irritability; Sleep   Psychosis:   None   Duration of Psychotic symptoms:    Trauma:   None   Obsessions:   None   Compulsions:   None   Inattention:   None   Hyperactivity/Impulsivity:   None   Oppositional/Defiant Behaviors:   None   Emotional Irregularity:   None   Other Mood/Personality Symptoms:   none    Mental Status Exam Appearance and self-care  Stature:   Average   Weight:   Average weight   Clothing:   -- (pt was in scrubs)   Grooming:  No data recorded  Cosmetic use:  No data  recorded  Posture/gait:   -- (pt was laying in bed)   Motor activity:   Restless   Sensorium  Attention:   Normal   Concentration:   Normal   Orientation:   X5   Recall/memory:   Normal   Affect and Mood  Affect:   Negative   Mood:   Irritable   Relating  Eye contact:   Normal   Facial expression:   Constricted   Attitude toward examiner:   Cooperative   Thought and Language  Speech flow:  Normal   Thought content:   Appropriate to Mood and Circumstances   Preoccupation:   None   Hallucinations:   None   Organization:  No data recorded  [employer] of Knowledge:   Fair   Intelligence:   Average   Abstraction:   Normal   Judgement:   Fair   Reality Testing:   Adequate   Insight:   Fair  Decision Making:   Normal   Social Functioning  Social Maturity:   Responsible   Social Judgement:   Normal   Stress  Stressors:   -- (pt denied)   Coping Ability:   Overwhelmed   Skill Deficits:   Self-control   Supports:   Other (Comment) (pt reports her boyfriend)     Religion: Religion/Spirituality Are You A Religious Person?: No  Leisure/Recreation: Leisure / Recreation Do You Have Hobbies?: No  Exercise/Diet: Exercise/Diet Do You Exercise?: No Have You Gained or Lost A Significant Amount of Weight in the Past Six Months?: No Do You Follow a Special Diet?: No Do You Have Any Trouble Sleeping?: Yes Explanation of Sleeping Difficulties: Pt reports rarely sleeping   CCA Employment/Education Employment/Work Situation: Employment / Work Situation Employment Situation: Employed Work Stressors: pt denied Patient's Job has Been Impacted by Current Illness: No Has Patient ever Been in Passenger transport manager?: No  Education: Education Is Patient Currently Attending School?: No Did Physicist, medical?: Yes What Type of College Degree Do you Have?: pt reports some college Did You Have An Individualized Education  Program (IIEP): No Did You Have Any Difficulty At [employer]?: No Patient's Education Has Been Impacted by Current Illness: No   CCA Family/Childhood History Family and Relationship History: Family history Marital status: Single Does patient have children?: No  Childhood History:  Childhood History By whom was/is the patient raised?:  (pt would not answer.) Did patient suffer any verbal/emotional/physical/sexual abuse as a child?: No Did patient suffer from severe childhood neglect?: No Has patient ever been sexually abused/assaulted/raped as an adolescent or adult?: No Witnessed domestic violence?: No Has patient been affected by domestic violence as an adult?: No  Child/Adolescent Assessment:     CCA Substance Use Alcohol/Drug Use: Alcohol / Drug Use Pain Medications: pt denied Prescriptions: pt denied Over the Counter: pt denied History of alcohol / drug use?: Yes Longest period of sobriety (when/how long): unknown Negative Consequences of Use:  (pt denied) Withdrawal Symptoms: None Substance #1 Name of Substance 1: alcohol 1 - Age of First Use: unk 1 - Amount (size/oz): unk 1 - Frequency: unk 1 - Duration: unk 1 - Last Use / Amount: unk 1 - Method of Aquiring: unk 1- Route of Use: oral Substance #2 Name of Substance 2: Marijuana 2 - Age of First Use: unk 2 - Amount (size/oz): unk 2 - Frequency: unk 2 - Duration: unk 2 - Last Use / Amount: unk 2 - Method of Aquiring: unk 2 - Route of Substance Use: oral                     ASAM's:  Six Dimensions of Multidimensional Assessment  Dimension 1:  Acute Intoxication and/or Withdrawal Potential:      Dimension 2:  Biomedical Conditions and Complications:   Dimension 2:  Description of patient's biomedical conditions and  complications:  (pt refused to answer.)  Dimension 3:  Emotional, Behavioral, or Cognitive Conditions and Complications:  Dimension 3:  Description of emotional, behavioral, or cognitive  conditions and complications:  (pt refused to answer.)  Dimension 4:  Readiness to Change:  Dimension 4:  Description of Readiness to Change criteria:  (pt refused to answer.)  Dimension 5:  Relapse, Continued use, or Continued Problem Potential:  Dimension 5:  Relapse, continued use, or continued problem potential critiera description:  (pt refused to answer.)  Dimension 6:  Recovery/Living Environment:  Dimension 6:  Recovery/Iiving environment criteria description:  (pt refused  to answer.)  ASAM Severity Score:    ASAM Recommended Level of Treatment: ASAM Recommended Level of Treatment:  (unable to score.)   Substance use Disorder (SUD) Substance Use Disorder (SUD)  Checklist Symptoms of Substance Use:  (pt refused to answer.)  Recommendations for Services/Supports/Treatments: Recommendations for Services/Supports/Treatments Recommendations For Services/Supports/Treatments: CD-IOP Intensive Chemical Dependency Program, Medication Management  Discharge Disposition: Discharge Disposition Medical Exam completed: Yes Disposition of Patient: Discharge Mode of transportation if patient is discharged/movement?: Other (comment) (Cab or pt report she can contact her boyfriend.)  DSM5 Diagnoses: Patient Active Problem List   Diagnosis Date Noted   Severe recurrent major depression without psychotic features ([place]) 08/15/2019   MDD (major depressive disorder), recurrent episode, severe ([place]) 08/03/2018   Alcohol use disorder, severe, dependence ([place]) 06/10/2017   Clonidine overdose 04/28/2017   Alcohol use 04/28/2017   Intentional drug overdose ([place])    Severe episode of recurrent major depressive disorder, without psychotic features ([place])      Referrals to Alternative Service(s): Referred to Alternative Service(s):   Place:   Date:   Time:    Referred to Alternative Service(s):   Place:   Date:   Time:    Referred to Alternative Service(s):   Place:   Date:   Time:    Referred to  Alternative Service(s):   Place:   Date:   Time:     Marcie Shearon M. Wayman Hoard, Deon Pilling
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
## 2021-10-29 NOTE — Discharge Instructions (Signed)
Discharge recommendations:  Patient is to take medications as prescribed. Please see information for follow-up appointment with psychiatry and therapy. Please follow up with your primary care provider for all medical related needs.  It is recommended that you attend [facility] ([address] [place], [place] [address], [phone]) 2nd floor outpatient psychiatry/medication management and therapy walk-in hours for medication management and therapy. This walk-in hour schedule is shown below:   Therapy Walk-in Hours  Monday-Wednesday: 8 AM until slots are full  Friday: 1 PM to 5 PM  For Monday to Wednesday, it is recommended that patients arrive between 7:30 AM and 7:45 AM because patients will be seen in the order of arrival.  For Friday, we ask that patients arrive between 12 PM to 12:30 PM.  Go to the second floor on arrival and check in.  **Availability is limited; therefore, patients may not be seen on the same day.**  Medication management walk-ins:  Monday to Friday: 8 AM to 11 AM.  It is recommended that patients arrive by 7:30 AM to 7:45 AM because patients will be seen in the order of arrival.  Go to the second floor on arrival and check in.  **Availability is limited; therefore, patients may not be seen on the same day.**   Therapy: We recommend that patient participate in individual therapy to address mental health concerns.  Medications: The patient is to contact a medical professional and/or outpatient provider to address any new side effects that develop. Patient should update outpatient providers of any new medications and/or medication changes.   Safety:  The patient should abstain from use of illicit substances/drugs and abuse of any medications. If symptoms worsen or do not continue to improve or if the patient becomes actively suicidal or homicidal then it is recommended that the patient return to the closest hospital emergency department, the  [facility], or call 911 for further evaluation and treatment. National Suicide Prevention Lifeline 1-800-SUICIDE or 213-836-8550.  About 988 988 offers 24/7 access to trained crisis counselors who can help people experiencing mental health-related distress. People can call or text 988 or chat 988lifeline.org for themselves or if they are worried about a loved one who may need crisis support.

## 2021-10-29 NOTE — ED Notes (Addendum)
Patient to be discharged with a list of resources. Personal belongings returned.

## 2021-10-29 NOTE — ED Notes (Signed)
Went in a woke patient up again. Patient asking for beverage, which is provided.

## 2021-10-29 NOTE — ED Notes (Signed)
Patient given discharge instructions, all questions answered. Patient in possession of all belongings, directed to the discharge area  

## 2022-03-04 ENCOUNTER — Encounter (HOSPITAL_COMMUNITY): Payer: Self-pay

## 2022-03-04 ENCOUNTER — Other Ambulatory Visit: Payer: Self-pay

## 2022-03-04 ENCOUNTER — Emergency Department (HOSPITAL_COMMUNITY)
Admission: EM | Admit: 2022-03-04 | Discharge: 2022-03-04 | Disposition: A | Discharge status: Home / Self Care | Payer: No Typology Code available for payment source | Attending: Emergency Medicine | Admitting: Emergency Medicine

## 2022-03-04 DIAGNOSIS — R0602 Shortness of breath: Secondary | ICD-10-CM | POA: Insufficient documentation

## 2022-03-04 DIAGNOSIS — Z7951 Long term (current) use of inhaled steroids: Secondary | ICD-10-CM | POA: Insufficient documentation

## 2022-03-04 DIAGNOSIS — J9801 Acute bronchospasm: Secondary | ICD-10-CM | POA: Insufficient documentation

## 2022-03-04 MED ORDER — PREDNISONE 20 MG PO TABS
60.0000 mg | ORAL_TABLET | Freq: Once | ORAL | Status: AC
  Administered 2022-03-04: 60 mg via ORAL
  Filled 2022-03-04: qty 3

## 2022-03-04 MED ORDER — PREDNISONE 20 MG PO TABS: ORAL_TABLET | ORAL | 0 refills | Status: DC

## 2022-03-04 MED ORDER — ALBUTEROL SULFATE (2.5 MG/3ML) 0.083% IN NEBU
2.5000 mg | INHALATION_SOLUTION | RESPIRATORY_TRACT | Status: AC
  Administered 2022-03-04: 2.5 mg via RESPIRATORY_TRACT
  Filled 2022-03-04: qty 3

## 2022-10-25 ENCOUNTER — Emergency Department (HOSPITAL_COMMUNITY)
Admission: EM | Admit: 2022-10-25 | Discharge: 2022-10-25 | Disposition: A | Discharge status: Home / Self Care | Payer: Medicaid Other | Attending: Emergency Medicine | Admitting: Emergency Medicine

## 2022-10-25 ENCOUNTER — Emergency Department (HOSPITAL_COMMUNITY): Payer: Medicaid Other

## 2022-10-25 ENCOUNTER — Other Ambulatory Visit: Payer: Self-pay

## 2022-10-25 ENCOUNTER — Encounter (HOSPITAL_COMMUNITY): Payer: Self-pay | Admitting: *Deleted

## 2022-10-25 DIAGNOSIS — R0602 Shortness of breath: Secondary | ICD-10-CM | POA: Diagnosis present

## 2022-10-25 DIAGNOSIS — J40 Bronchitis, not specified as acute or chronic: Secondary | ICD-10-CM | POA: Insufficient documentation

## 2022-10-25 HISTORY — DX: Unspecified asthma, uncomplicated: J45.909

## 2022-10-25 LAB — BASIC METABOLIC PANEL
Anion gap: 20 — ABNORMAL HIGH (ref 5–15)
BUN: 9 mg/dL (ref 6–20)
CO2: 18 mmol/L — ABNORMAL LOW (ref 22–32)
Calcium: 8.6 mg/dL — ABNORMAL LOW (ref 8.9–10.3)
Chloride: 96 mmol/L — ABNORMAL LOW (ref 98–111)
Creatinine, Ser: 0.73 mg/dL (ref 0.44–1.00)
GFR, Estimated: >60 mL/min (ref >60)
Glucose, Bld: 90 mg/dL (ref 70–99)
Potassium: 3.1 mmol/L — ABNORMAL LOW (ref 3.5–5.1)
Sodium: 134 mmol/L — ABNORMAL LOW (ref 135–145)

## 2022-10-25 LAB — I-STAT BETA HCG BLOOD, ED (MC, WL, AP ONLY): I-stat hCG, quantitative: <5 m[IU]/mL (ref <5)

## 2022-10-25 LAB — CBC
HCT: 34.8 % — ABNORMAL LOW (ref 36.0–46.0)
Hemoglobin: 12.2 g/dL (ref 12.0–15.0)
MCH: 33.3 pg (ref 26.0–34.0)
MCHC: 35.1 g/dL (ref 30.0–36.0)
MCV: 95.1 fL (ref 80.0–100.0)
Platelets: 146 10*3/uL — ABNORMAL LOW (ref 150–400)
RBC: 3.66 MIL/uL — ABNORMAL LOW (ref 3.87–5.11)
RDW: 15.5 % (ref 11.5–15.5)
WBC: 4.3 10*3/uL (ref 4.0–10.5)
nRBC: 0 % (ref 0.0–0.2)

## 2022-10-25 LAB — TROPONIN I (HIGH SENSITIVITY): Troponin I (High Sensitivity): 13 ng/L (ref <18)

## 2022-10-25 MED ORDER — ALBUTEROL SULFATE (2.5 MG/3ML) 0.083% IN NEBU
5.0000 mg | INHALATION_SOLUTION | Freq: Once | RESPIRATORY_TRACT | Status: AC
  Administered 2022-10-25: 5 mg via RESPIRATORY_TRACT
  Filled 2022-10-25: qty 6

## 2022-10-25 MED ORDER — IPRATROPIUM BROMIDE 0.02 % IN SOLN
0.5000 mg | Freq: Once | RESPIRATORY_TRACT | Status: AC
  Administered 2022-10-25: 0.5 mg via RESPIRATORY_TRACT
  Filled 2022-10-25: qty 2.5

## 2022-10-25 MED ORDER — POTASSIUM CHLORIDE CRYS ER 20 MEQ PO TBCR
40.0000 meq | EXTENDED_RELEASE_TABLET | Freq: Once | ORAL | Status: AC
  Administered 2022-10-25: 40 meq via ORAL
  Filled 2022-10-25: qty 2

## 2022-10-25 MED ORDER — ALBUTEROL SULFATE HFA 108 (90 BASE) MCG/ACT IN AERS
2.0000 | INHALATION_SPRAY | Freq: Once | RESPIRATORY_TRACT | Status: AC
  Administered 2022-10-25: 2 via RESPIRATORY_TRACT
  Filled 2022-10-25: qty 6.7

## 2022-10-25 MED ORDER — PREDNISONE 20 MG PO TABS: ORAL_TABLET | ORAL | 0 refills | Status: AC

## 2022-10-25 MED ORDER — PREDNISONE 20 MG PO TABS
60.0000 mg | ORAL_TABLET | Freq: Once | ORAL | Status: AC
  Administered 2022-10-25: 60 mg via ORAL
  Filled 2022-10-25: qty 3

## 2022-10-25 NOTE — ED Triage Notes (Signed)
The pt is c/o some  sob  no acute distress speacking sentences without stopping for air  lmp up and down  going through  [employer]

## 2022-10-25 NOTE — ED Notes (Signed)
Patient ambulated to restroom independently and returned to room without assistance. Patient reports feeling better but not back to baseline.

## 2022-10-25 NOTE — ED Provider Notes (Signed)
[facility] Provider Note   CSN: [id number] Arrival date & time: 10/25/22  1733     History  Chief Complaint  Patient presents with   Shortness of Courtney Hansen is a 47 y.o. female history of bronchospasm, here presenting with shortness of breath.  Patient states that she has been short of breath for the last week or so.  She states that she used her albuterol with minimal relief.  Patient states that she has a history of bronchospasm that required steroids and albuterol previously.  Denies any fevers.  Patient states that she is very COVID conscious and has been wearing her N95 everywhere and denies any contacts positive for COVID.  The history is provided by the patient.       Home Medications Prior to Admission medications   Medication Sig Start Date End Date Taking? Authorizing Provider  albuterol (VENTOLIN HFA) 108 (90 Base) MCG/ACT inhaler Inhale 1-2 puffs into the lungs every 6 (six) hours as needed for wheezing or shortness of breath.    [provider]  gabapentin (NEURONTIN) 100 MG capsule Take 300 mg by mouth 3 (three) times daily. For agitation/pain management Patient not taking: Reported on 03/04/2022    [provider]  hydrOXYzine (VISTARIL) 25 MG capsule Take 25 mg by mouth daily as needed for anxiety.    [provider]  mirtazapine (REMERON) 15 MG tablet Take 15 mg by mouth at bedtime. Patient not taking: Reported on 03/04/2022    [provider]  predniSONE (DELTASONE) 20 MG tablet 3 tabs po day one, then 2 po daily x 4 days 03/04/22   Randal Buba, April, MD      Allergies    Patient has no known allergies.    Review of Systems   Review of Systems  Respiratory:  Positive for shortness of breath.   All other systems reviewed and are negative.   Physical Exam Updated Vital Signs BP (!) 147/94   Pulse (!) 102   Temp 98.5 F (36.9 C)   Resp (!) 22   Ht 4' 10"$  (1.473 m)    Wt 43.1 kg   SpO2 100%   BMI 19.86 kg/m  Physical Exam Vitals and nursing note reviewed.  Constitutional:      Comments: Slightly tachypneic  HENT:     Head: Normocephalic.  Eyes:     Extraocular Movements: Extraocular movements intact.     Pupils: Pupils are equal, round, and reactive to light.  Cardiovascular:     Rate and Rhythm: Normal rate and regular rhythm.  Pulmonary:     Comments: Diminished bilaterally.  No wheezing or crackles Musculoskeletal:        General: Normal range of motion.     Cervical back: Normal range of motion and neck supple.  Skin:    General: Skin is warm.     Capillary Refill: Capillary refill takes less than 2 seconds.  Neurological:     General: No focal deficit present.     Mental Status: She is oriented to person, place, and time.  Psychiatric:        Mood and Affect: Mood normal.        Behavior: Behavior normal.     ED Results / Procedures / Treatments   Labs (all labs ordered are listed, but only abnormal results are displayed) Labs Reviewed  CBC - Abnormal; Notable for the following components:      Result  Value   RBC 3.66 (*)    HCT 34.8 (*)    Platelets 146 (*)    All other components within normal limits  BASIC METABOLIC PANEL  I-STAT BETA HCG BLOOD, ED ([facility], [facility], [facility] ONLY)  TROPONIN I (HIGH SENSITIVITY)  TROPONIN I (HIGH SENSITIVITY)    EKG EKG Interpretation  Date/Time:  Saturday October 25 2022 17:49:27 EST Ventricular Rate:  101 PR Interval:  144 QRS Duration: 78 QT Interval:  356 QTC Calculation: 461 R Axis:   82 Text Interpretation: Sinus tachycardia ST & T wave abnormality, consider inferior ischemia Abnormal ECG When compared with ECG of 23-Jan-2020 21:29, No significant change since last tracing Confirmed by Wandra Arthurs [phone]) on 10/25/2022 7:24:46 PM  Radiology DG Chest 2 View  Result Date: 10/25/2022 CLINICAL DATA:  Chest pain difficulty breathing EXAM: CHEST - 2 VIEW COMPARISON:  None Available.  FINDINGS: Normal mediastinum and cardiac silhouette. Normal pulmonary vasculature. No evidence of effusion, infiltrate, or pneumothorax. No acute bony abnormality. IMPRESSION: No acute cardiopulmonary process. Electronically Signed   By: Suzy Bouchard M.D.   On: 10/25/2022 18:43    Procedures Procedures    Medications Ordered in ED Medications  albuterol (PROVENTIL) (2.5 MG/3ML) 0.083% nebulizer solution 5 mg (5 mg Nebulization Given 10/25/22 1924)  ipratropium (ATROVENT) nebulizer solution 0.5 mg (0.5 mg Nebulization Given 10/25/22 1924)  predniSONE (DELTASONE) tablet 60 mg (60 mg Oral Given 10/25/22 1923)    ED Course/ Medical Decision Making/ A&P                             Medical Decision Making [name] is a 47 y.o. female here presenting with shortness of breath.  Patient states that she gets bronchospasms.  Consider asthma exacerbation versus bronchospasm versus pneumonia.  Plan to get CBC and BMP and chest x-ray.  Will give nebs and prednisone and reassess  10:20 PM Chest x-ray is clear.  Labs unremarkable.  Potassium is 3.1 and supplemented.  Will discharge home with albuterol and steroids   Problems Addressed: Bronchitis: acute illness or injury  Amount and/or Complexity of Data Reviewed Labs: ordered. Decision-making details documented in ED Course. Radiology: ordered and independent interpretation performed. Decision-making details documented in ED Course.  Risk Prescription drug management.    Final Clinical Impression(s) / ED Diagnoses Final diagnoses:  None    Rx / DC Orders ED Discharge Orders     None         Drenda Freeze, MD 10/25/22 2221

## 2022-10-25 NOTE — Discharge Instructions (Signed)
Take prednisone as prescribed  Use albuterol every 4 hours as needed  See your doctor for follow-up  Return to ER if you have worse shortness of breath or cough or fever

## 2022-11-25 ENCOUNTER — Ambulatory Visit
Admission: EM | Admit: 2022-11-25 | Discharge: 2022-11-25 | Disposition: A | Discharge status: Home / Self Care | Payer: Medicaid Other | Attending: Nurse Practitioner | Admitting: Nurse Practitioner

## 2022-11-25 DIAGNOSIS — B9689 Other specified bacterial agents as the cause of diseases classified elsewhere: Secondary | ICD-10-CM

## 2022-11-25 DIAGNOSIS — J209 Acute bronchitis, unspecified: Secondary | ICD-10-CM | POA: Diagnosis not present

## 2022-11-25 DIAGNOSIS — J329 Chronic sinusitis, unspecified: Secondary | ICD-10-CM

## 2022-11-25 MED ORDER — PROMETHAZINE-DM 6.25-15 MG/5ML PO SYRP: 5.0000 mL | ORAL_SOLUTION | Freq: Four times a day (QID) | ORAL | 0 refills | Status: AC | PRN

## 2022-11-25 MED ORDER — AMOXICILLIN-POT CLAVULANATE 875-125 MG PO TABS: 1.0000 | ORAL_TABLET | Freq: Two times a day (BID) | ORAL | 0 refills | Status: AC

## 2022-11-25 MED ORDER — FLUTICASONE PROPIONATE 50 MCG/ACT NA SUSP: 1.0000 | Freq: Every day | NASAL | 0 refills | Status: AC

## 2022-11-25 NOTE — Discharge Instructions (Signed)
Augmentin twice daily for 10 days Flonase daily Promethazine DM as needed for cough.  Please note this medication can make you drowsy.  Do not drink alcohol or drive on this medication Nasal rinses as tolerated Rest and fluids Follow-up with your PCP if your symptoms do not improve Please go to the emergency room for any worsening symptoms

## 2022-11-25 NOTE — ED Provider Notes (Signed)
[facility]    CSN: [id number] Arrival date & time: 11/25/22  1114      History   Chief Complaint Chief Complaint  Patient presents with   Cough    HPI Courtney Hansen is a 47 y.o. female  presents for evaluation of URI symptoms for 3 weeks. Patient reports associated symptoms of cough, sinus pressure/pain with purulent drainage. Denies N/V/D, fevers, ear pain, sore throat, body aches, shortness of breath. Patient does  have a hx of asthma.  She has an albuterol inhaler but has not needed to use since symptom onset.  No known sick contacts.  Pt has taken NyQuil OTC for symptoms. Pt has no other concerns at this time.    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 No pertinent surgical history.  OB History   No obstetric history on file.      Home Medications    Prior to Admission medications   Medication Sig Start Date End Date Taking? Authorizing Provider  albuterol (VENTOLIN HFA) 108 (90 Base) MCG/ACT inhaler Inhale 1-2 puffs into the lungs every 6 (six) hours as needed for wheezing or shortness of breath.    [provider]  amoxicillin-clavulanate (AUGMENTIN) 875-125 MG tablet Take 1 tablet by mouth every 12 (twelve) hours for 10 days. 11/25/22 12/05/22 Yes Melynda Ripple, NP  fluticasone (FLONASE) 50 MCG/ACT nasal spray Place 1 spray into both nostrils daily. 11/25/22  Yes Melynda Ripple, NP  gabapentin  (NEURONTIN) 100 MG capsule Take 300 mg by mouth 3 (three) times daily. For agitation/pain management Patient not taking: Reported on 03/04/2022    [provider]  hydrOXYzine (VISTARIL) 25 MG capsule Take 25 mg by mouth daily as needed for anxiety.    [provider]  mirtazapine (REMERON) 15 MG tablet Take 15 mg by mouth at bedtime. Patient not taking: Reported on 03/04/2022    [provider]  predniSONE (DELTASONE) 20 MG tablet Take 60 mg daily x 2 days then 40 mg daily x 2 days then 20 mg daily x 2 days 10/25/22   Drenda Freeze, MD  promethazine-dextromethorphan (PROMETHAZINE-DM) 6.25-15 MG/5ML syrup Take 5 mLs by mouth 4 (four) times daily as needed for cough. 11/25/22  Yes Melynda Ripple, NP    Family History Family History  Problem Relation Age of Onset   Schizophrenia Brother    Depression Brother     Social History Social History   Tobacco Use   Smoking status: Every Day    Packs/day: 1    Types: Cigarettes   Smokeless tobacco: Never   Tobacco comments:    Pt declined counsel  Vaping Use   Vaping Use: Never used  Substance Use Topics   Alcohol use: Yes    Comment: occasional drinker   Drug use: Yes    Types: Marijuana, Methamphetamines     Allergies   Patient has no known allergies.   Review  of Systems Review of Systems  HENT:  Positive for congestion, sinus pressure and sinus pain.   Respiratory:  Positive for cough.      Physical Exam Triage Vital Signs ED Triage Vitals  Enc Vitals Group     BP 11/25/22 1121 132/85     Pulse Rate 11/25/22 1121 60     Resp 11/25/22 1121 19     Temp 11/25/22 1121 (!) 97.4 F (36.3 C)     Temp Source 11/25/22 1121 Oral     SpO2 11/25/22 1121 98 %     Weight --      Height --      Head Circumference --      Peak Flow --      Pain Score 11/25/22 1120 0     Pain Loc --      Pain Edu? --      Excl. in [name]? --    No data found.  Updated Vital Signs BP 132/85 (BP Location: Right Arm)    Pulse 60   Temp (!) 97.4 F (36.3 C) (Oral)   Resp 19   SpO2 98%   Visual Acuity Right Eye Distance:   Left Eye Distance:   Bilateral Distance:    Right Eye Near:   Left Eye Near:    Bilateral Near:     Physical Exam Vitals and nursing note reviewed.  Constitutional:      General: She is not in acute distress.    Appearance: She is well-developed. She is not ill-appearing.  HENT:     Head: Normocephalic and atraumatic.     Right Ear: Tympanic membrane and ear canal normal.     Left Ear: Tympanic membrane and ear canal normal.     Nose: Congestion present.     Right Turbinates: Swollen and pale.     Left Turbinates: Swollen and pale.     Right Sinus: Maxillary sinus tenderness and frontal sinus tenderness present.     Left Sinus: Frontal sinus tenderness present.     Mouth/Throat:     Mouth: Mucous membranes are moist.     Pharynx: Oropharynx is clear. Uvula midline. No posterior oropharyngeal erythema.     Tonsils: No tonsillar exudate or tonsillar abscesses.  Eyes:     Conjunctiva/sclera: Conjunctivae normal.     Pupils: Pupils are equal, round, and reactive to light.  Cardiovascular:     Rate and Rhythm: Normal rate and regular rhythm.     Heart sounds: Normal heart sounds.  Pulmonary:     Effort: Pulmonary effort is normal.     Breath sounds: Normal breath sounds.  Musculoskeletal:     Cervical back: Normal range of motion and neck supple.  Lymphadenopathy:     Cervical: No cervical adenopathy.  Skin:    General: Skin is warm and dry.  Neurological:     General: No focal deficit present.     Mental Status: She is alert and oriented to person, place, and time.  Psychiatric:        Mood and Affect: Mood normal.        Behavior: Behavior normal.      UC Treatments / Results  Labs (all labs ordered are listed, but only abnormal results are displayed) Labs Reviewed - No data to display  EKG   Radiology No results found.  Procedures Procedures  (including critical care time)  Medications Ordered in UC Medications - No data to display  Initial Impression / Assessment and  Plan / UC Course  I have reviewed the triage vital signs and the nursing notes.  Pertinent labs & imaging results that were available during my care of the patient were reviewed by me and considered in my medical decision making (see chart for details).     Start Augmentin twice daily for 10 days Flonase daily Promethazine DM as needed for cough.  Side effect profile reviewed Nasal rinses as tolerated Rest and fluids PCP follow-up if symptoms do not improve ER precautions reviewed and patient verbalized understanding Final Clinical Impressions(s) / UC Diagnoses   Final diagnoses:  Bacterial sinusitis  Acute bronchitis, unspecified organism     Discharge Instructions      Augmentin twice daily for 10 days Flonase daily Promethazine DM as needed for cough.  Please note this medication can make you drowsy.  Do not drink alcohol or drive on this medication Nasal rinses as tolerated Rest and fluids Follow-up with your PCP if your symptoms do not improve Please go to the emergency room for any worsening symptoms   ED Prescriptions     Medication Sig Dispense Auth. Provider   amoxicillin-clavulanate (AUGMENTIN) 875-125 MG tablet Take 1 tablet by mouth every 12 (twelve) hours for 10 days. 20 tablet Melynda Ripple, NP   fluticasone (FLONASE) 50 MCG/ACT nasal spray Place 1 spray into both nostrils daily. 15.8 mL Melynda Ripple, NP   promethazine-dextromethorphan (PROMETHAZINE-DM) 6.25-15 MG/5ML syrup Take 5 mLs by mouth 4 (four) times daily as needed for cough. 118 mL Melynda Ripple, NP      PDMP not reviewed this encounter.   Melynda Ripple, NP 11/25/22 1149

## 2022-11-25 NOTE — ED Triage Notes (Signed)
Pt presents with c/o nasal congestion and cough X 3 weeks.   States she has not gotten better.

## 2022-12-28 ENCOUNTER — Emergency Department (HOSPITAL_COMMUNITY)
Admission: EM | Admit: 2022-12-28 | Discharge: 2022-12-28 | Disposition: A | Discharge status: Home / Self Care | Payer: Medicaid Other | Attending: Emergency Medicine | Admitting: Emergency Medicine

## 2022-12-28 DIAGNOSIS — X58XXXA Exposure to other specified factors, initial encounter: Secondary | ICD-10-CM | POA: Insufficient documentation

## 2022-12-28 DIAGNOSIS — F10920 Alcohol use, unspecified with intoxication, uncomplicated: Secondary | ICD-10-CM | POA: Insufficient documentation

## 2022-12-28 DIAGNOSIS — S61011A Laceration without foreign body of right thumb without damage to nail, initial encounter: Secondary | ICD-10-CM | POA: Diagnosis not present

## 2022-12-28 NOTE — ED Notes (Addendum)
Pt refuses help or care from 3 other staff members. Assessed by ER MD. Continues to refuse care and is allowed to leave. Pt continues to yell and be tearful. Given wound supplies to take with her. Please not triage information is incomplete d/t pt being uncooperative.

## 2022-12-28 NOTE — ED Notes (Signed)
Patient refused vitals, care, and discharge paper work.  Escorted out of department by [employer].

## 2022-12-28 NOTE — ED Provider Notes (Signed)
[place] EMERGENCY DEPARTMENT AT [facility] Provider Note   CSN: [id number] Arrival date & time: 12/28/22  1942     History  Chief Complaint  Patient presents with   Alcohol Intoxication   Laceration    Courtney Hansen is a 47 y.o. female.  47 yo F with a chief complaints of a thumb laceration.  She is not really willing to tell me how this happened.  She was picked up and brought here to be evaluated.  Has been drinking today and is not willing to give me much information behind that.  She would not like to be seen and would like to go home.   Alcohol Intoxication  Laceration      Home Medications Prior to Admission medications   Medication Sig Start Date End Date Taking? Authorizing Provider  albuterol (VENTOLIN HFA) 108 (90 Base) MCG/ACT inhaler Inhale 1-2 puffs into the lungs every 6 (six) hours as needed for wheezing or shortness of breath.    [provider]  fluticasone (FLONASE) 50 MCG/ACT nasal spray Place 1 spray into both nostrils daily. 11/25/22   [name], NP  gabapentin (NEURONTIN) 100 MG capsule Take 300 mg by mouth 3 (three) times daily. For agitation/pain management Patient not taking: Reported on 03/04/2022    [provider]  hydrOXYzine (VISTARIL) 25 MG capsule Take 25 mg by mouth daily as needed for anxiety.    [provider]  mirtazapine (REMERON) 15 MG tablet Take 15 mg by mouth at bedtime. Patient not taking: Reported on 03/04/2022    [provider]  predniSONE (DELTASONE) 20 MG tablet Take 60 mg daily x 2 days then 40 mg daily x 2 days then 20 mg daily x 2 days 10/25/22   [name], MD  promethazine-dextromethorphan (PROMETHAZINE-DM) 6.25-15 MG/5ML syrup Take 5 mLs by mouth 4 (four) times daily as needed for cough. 11/25/22   [name], NP      Allergies    Patient has no known allergies.    Review of Systems   Review of Systems  Physical Exam Updated Vital Signs There were no  vitals taken for this visit. Physical Exam Vitals and nursing note reviewed.  Constitutional:      General: She is not in acute distress.    Appearance: She is well-developed. She is not diaphoretic.  HENT:     Head: Normocephalic and atraumatic.  Eyes:     Pupils: Pupils are equal, round, and reactive to light.  Cardiovascular:     Rate and Rhythm: Normal rate and regular rhythm.     Heart sounds: No murmur heard.    No friction rub. No gallop.  Pulmonary:     Effort: Pulmonary effort is normal.     Breath sounds: No wheezing or rales.  Abdominal:     General: There is no distension.     Palpations: Abdomen is soft.     Tenderness: There is no abdominal tenderness.  Musculoskeletal:        General: No tenderness.     Cervical back: Normal range of motion and neck supple.     Comments: Laceration to the palmar aspect of the right thumb, I am unable to fully evaluate this as she is unwilling to have me look at it.  Skin:    General: Skin is warm and dry.  Neurological:     Mental Status: She is alert and oriented to person, place, and time.  Comments: Patient is able to tell me where she is and where she lives and what had occurred leading up to me meeting her in the room.  Psychiatric:        Behavior: Behavior normal.     ED Results / Procedures / Treatments   Labs (all labs ordered are listed, but only abnormal results are displayed) Labs Reviewed - No data to display  EKG None  Radiology No results found.  Procedures Procedures    Medications Ordered in ED Medications - No data to display  ED Course/ Medical Decision Making/ A&P                             Medical Decision Making  47 yo F with a chief complaints of a laceration to the palmar aspect of the right thumb.  She is not willing to give me much more information.  Per EMS it may have happened as long as 8 hours ago.  The patient is upset that some new people are around her.  She would like to  go home at this time.  I discussed with her that we are here and willing to help take care of her wound for her.  I encouraged her to return anytime she wishes to be reevaluated.  At this point I do not feel that she is so intoxicated that she cannot make her own decisions.  Will have her follow-up with her family doctor in the office.  8:16 PM:  I have discussed the diagnosis/risks/treatment options with the patient.  Evaluation and diagnostic testing in the emergency department does not suggest an emergent condition requiring admission or immediate intervention beyond what has been performed at this time.  They will follow up with PCP. We also discussed returning to the ED immediately if new or worsening sx occur. We discussed the sx which are most concerning (e.g., sudden worsening pain, fever, inability to tolerate by mouth) that necessitate immediate return. Medications administered to the patient during their visit and any new prescriptions provided to the patient are listed below.  Medications given during this visit Medications - No data to display   The patient appears reasonably screen and/or stabilized for discharge and I doubt any other medical condition or other [facility] requiring further screening, evaluation, or treatment in the ED at this time prior to discharge.          Final Clinical Impression(s) / ED Diagnoses Final diagnoses:  Alcoholic intoxication without complication  Laceration of right thumb without foreign body without damage to nail, initial encounter    Rx / DC Orders ED Discharge Orders     None         [name], DO 12/28/22 2016

## 2022-12-28 NOTE — ED Triage Notes (Signed)
Pt presents via [facility] for laceration to R thumb and EtOH intoxication.  Refused vital signs from this RN, aggressive verbally in triage. Curses at RN and resists care.  Laceration may have occurred as long as 8hrs ago. Last drink 1 hr ago. Unknown EtOH amount.   Bleeding controlled prior to arrival but pt removes during triage despite RN asking not to.  EMS vital 110bpm. 98%. Uynable to get BP d/t agitation.

## 2022-12-28 NOTE — ED Notes (Signed)
During triage, RN asked to bandage pt hand before she uses phone since she is bleeding on sink, floor, and self. While bandaging wound, pt grabbed RN's arm and pushed RN's arm when RN stated that she could not use phone. RN asked pt to stop and pt began yelling at staff and stating she wants to leave.

## 2022-12-28 NOTE — Discharge Instructions (Signed)
We offered to help you with the cut to your thumb here today.  You have declined for [place] to help you.  We are here if you need [place] and we are happy to help take care of this for you.  If you change your mind at any time please return and we will reevaluate.  Otherwise please rinse this out as best she can at home with soap and water.  You can apply an ointment to it a couple times a day.  Please return for rapid spreading redness or if you develop a fever.

## 2022-12-29 ENCOUNTER — Emergency Department (HOSPITAL_COMMUNITY): Payer: Medicaid Other

## 2022-12-29 ENCOUNTER — Emergency Department (HOSPITAL_COMMUNITY)
Admission: EM | Admit: 2022-12-29 | Discharge: 2022-12-29 | Disposition: A | Discharge status: Home / Self Care | Payer: Medicaid Other | Attending: Emergency Medicine | Admitting: Emergency Medicine

## 2022-12-29 ENCOUNTER — Ambulatory Visit (HOSPITAL_COMMUNITY)
Admission: EM | Admit: 2022-12-29 | Discharge: 2022-12-29 | Disposition: A | Discharge status: Home / Self Care | Payer: Medicaid Other | Attending: Internal Medicine | Admitting: Internal Medicine

## 2022-12-29 ENCOUNTER — Encounter (HOSPITAL_COMMUNITY): Payer: Self-pay

## 2022-12-29 DIAGNOSIS — Z23 Encounter for immunization: Secondary | ICD-10-CM | POA: Diagnosis not present

## 2022-12-29 DIAGNOSIS — W268XXA Contact with other sharp object(s), not elsewhere classified, initial encounter: Secondary | ICD-10-CM | POA: Diagnosis not present

## 2022-12-29 DIAGNOSIS — S61011A Laceration without foreign body of right thumb without damage to nail, initial encounter: Secondary | ICD-10-CM

## 2022-12-29 MED ORDER — HYDROCODONE-ACETAMINOPHEN 5-325 MG PO TABS
1.0000 | ORAL_TABLET | Freq: Four times a day (QID) | ORAL | 0 refills | Status: DC | PRN
  Filled 2022-12-29: qty 5, 2d supply, fill #0

## 2022-12-29 MED ORDER — HYDROCODONE-ACETAMINOPHEN 5-325 MG PO TABS
1.0000 | ORAL_TABLET | Freq: Once | ORAL | Status: AC
  Administered 2022-12-29: 1 via ORAL
  Filled 2022-12-29: qty 1

## 2022-12-29 MED ORDER — HYDROCODONE-ACETAMINOPHEN 5-325 MG PO TABS: 1.0000 | ORAL_TABLET | Freq: Four times a day (QID) | ORAL | 0 refills | Status: AC | PRN

## 2022-12-29 MED ORDER — TETANUS-DIPHTH-ACELL PERTUSSIS 5-2.5-18.5 LF-MCG/0.5 IM SUSY
0.5000 mL | PREFILLED_SYRINGE | Freq: Once | INTRAMUSCULAR | Status: AC
  Administered 2022-12-29: 0.5 mL via INTRAMUSCULAR
  Filled 2022-12-29: qty 0.5

## 2022-12-29 MED ORDER — CEPHALEXIN 500 MG PO CAPS
500.0000 mg | ORAL_CAPSULE | Freq: Four times a day (QID) | ORAL | 0 refills | Status: DC
  Filled 2022-12-29: qty 20, 5d supply, fill #0

## 2022-12-29 MED ORDER — CEPHALEXIN 500 MG PO CAPS: 500.0000 mg | ORAL_CAPSULE | Freq: Four times a day (QID) | ORAL | 0 refills | Status: AC

## 2022-12-29 MED ORDER — FLUCONAZOLE 200 MG PO TABS: 200.0000 mg | ORAL_TABLET | Freq: Every day | ORAL | 0 refills | Status: AC

## 2022-12-29 NOTE — ED Provider Notes (Signed)
[place] [facility] AT [facility] Provider Note   CSN: [id number] Arrival date & time: 12/29/22  1919     History  Chief Complaint  Patient presents with   Finger Injury    Courtney Hansen is a 47 y.o. female.  Patient with noncontributory past medical history presents today with complaints of right thumb injury.  She states that same occurred yesterday when she cut it on a broken coffee mug.  She was seen for same yesterday but left prior to her wound being repaired.  She was seen in urgent care earlier today and was sent here for repair of her wound.  She denies any current bleeding.  She states that originally she did have numbness and decreased range of motion in her thumb, however it does seem to be improving and she now has full sensation.  She denies fevers or chills.  The history is provided by the patient. No language interpreter was used.       Home Medications Prior to Admission medications   Medication Sig Start Date End Date Taking? Authorizing Provider  albuterol (VENTOLIN HFA) 108 (90 Base) MCG/ACT inhaler Inhale 1-2 puffs into the lungs every 6 (six) hours as needed for wheezing or shortness of breath.    [provider]  fluticasone (FLONASE) 50 MCG/ACT nasal spray Place 1 spray into both nostrils daily. 11/25/22   [name], NP  gabapentin (NEURONTIN) 100 MG capsule Take 300 mg by mouth 3 (three) times daily. For agitation/pain management Patient not taking: Reported on 03/04/2022    [provider]  hydrOXYzine (VISTARIL) 25 MG capsule Take 25 mg by mouth daily as needed for anxiety.    [provider]  mirtazapine (REMERON) 15 MG tablet Take 15 mg by mouth at bedtime. Patient not taking: Reported on 03/04/2022    [provider]  predniSONE (DELTASONE) 20 MG tablet Take 60 mg daily x 2 days then 40 mg daily x 2 days then 20 mg daily x 2 days 10/25/22   [name], MD  promethazine-dextromethorphan  (PROMETHAZINE-DM) 6.25-15 MG/5ML syrup Take 5 mLs by mouth 4 (four) times daily as needed for cough. 11/25/22   [name], NP      Allergies    Patient has no known allergies.    Review of Systems   Review of Systems  Skin:  Positive for wound.  All other systems reviewed and are negative.   Physical Exam Updated Vital Signs BP 132/89 (BP Location: Right Arm)   Pulse 80   Temp 98.4 F (36.9 C) (Oral)   Resp 16   SpO2 98%  Physical Exam Vitals and nursing note reviewed.  Constitutional:      General: She is not in acute distress.    Appearance: Normal appearance. She is normal weight. She is not ill-appearing, toxic-appearing or diaphoretic.  HENT:     Head: Normocephalic and atraumatic.  Cardiovascular:     Rate and Rhythm: Normal rate.  Pulmonary:     Effort: Pulmonary effort is normal. No respiratory distress.  Musculoskeletal:        General: Normal range of motion.     Cervical back: Normal range of motion.     Comments: Curvilinear laceration noted to the middle phalanx of the palmar aspect of the right thumb. No foreign body. No active bleeding. Distal sensation intact. Capillary refill less than 2 seconds. Minimal ROM. Radial pulse intact and 2+  Skin:    General:  Skin is warm and dry.  Neurological:     General: No focal deficit present.     Mental Status: She is alert.  Psychiatric:        Mood and Affect: Mood normal.        Behavior: Behavior normal.     ED Results / Procedures / Treatments   Labs (all labs ordered are listed, but only abnormal results are displayed) Labs Reviewed - No data to display  EKG None  Radiology DG Finger Thumb Right  Result Date: 12/29/2022 CLINICAL DATA:  Right thumb laceration. EXAM: RIGHT THUMB 2+V COMPARISON:  None Available. FINDINGS: There is no evidence of fracture or dislocation. There is no evidence of arthropathy or other focal bone abnormality. A soft tissue defect is seen along the volar aspect of the  proximal right thumb. IMPRESSION: Soft tissue defect without evidence of acute fracture or dislocation. Electronically Signed   By: Aram Candela M.D.   On: 12/29/2022 22:37    Procedures Procedures    Medications Ordered in ED Medications  HYDROcodone-acetaminophen (NORCO/VICODIN) 5-325 MG per tablet 1 tablet (has no administration in time range)  Tdap (BOOSTRIX) injection 0.5 mL (0.5 mLs Intramuscular Given 12/29/22 2218)  HYDROcodone-acetaminophen (NORCO/VICODIN) 5-325 MG per tablet 1 tablet (1 tablet Oral Given 12/29/22 2218)    ED Course/ Medical Decision Making/ A&P                             Medical Decision Making Amount and/or Complexity of Data Reviewed Radiology: ordered.  Risk Prescription drug management.   Patient presents today with complaints of right thumb laceration yesterday. X-ray imaging obtained of same which has resulted and reveals  Soft tissue defect without evidence of acute fracture or dislocation.  I have personally reviewed and interpreted this imaging and agree with radiology interpretation.  Given time since the event being almost 24 hours, no indication for closing the wound with sutures at this time. I have soaked her hand in saline and dressed the wound with bacitracin ointment and a nonadherent dressing. Given her somewhat limited ROM, some concern for tendon injury and therefore placed in a velcro thumb spica splint and given referral to hand surgery for follow-up. Tdap updated. Will also give Keflex for antibiotic prophylaxis.  Patient does state that antibiotics give her yeast infection, therefore will send for fluconazole as well.  I have also given her a few doses of Norco to help with pain in the short-term.  Patient advised not to drive or operate heavy machinery while taking this medication.  PDMP reviewed. Evaluation and diagnostic testing in the emergency department does not suggest an emergent condition requiring admission or immediate  intervention beyond what has been performed at this time.  Plan for discharge with close PCP follow-up.  Patient is understanding and amenable with plan, educated on red flag symptoms that would prompt immediate return.  Patient discharged in stable condition.   Final Clinical Impression(s) / ED Diagnoses Final diagnoses:  Laceration of right thumb without foreign body without damage to nail, initial encounter    Rx / DC Orders ED Discharge Orders          Ordered    cephALEXin (KEFLEX) 500 MG capsule  4 times daily        12/29/22 2320    HYDROcodone-acetaminophen (NORCO/VICODIN) 5-325 MG tablet  Every 6 hours PRN        12/29/22 2320  An After Visit Summary was printed and given to the patient.     Vear Clock 12/29/22 2322    Lorre Nick, MD 12/30/22 2257

## 2022-12-29 NOTE — Discharge Instructions (Signed)
As we discussed, you are out of the window to have your thumb laceration repaired with sutures.  Your x-ray did not show any sign of fracture or dislocation.  I have cleaned your wound and placed antibiotic ointment on it.  You should perform dressing changes at least once a day and rinse your wound with soap and water.  Also, given your limitations in range of motion there is some concern you may have injured underlying structures such as a tendon that would not show up on an x-ray.  I have given you a referral to a hand specialist with a number to call to schedule an appointment for follow-up evaluation and management of this.  In the interim, I have given you a splint to wear to immobilize your thumb and you need to wear this at all times except while showering.  Additionally, to cover for any infection I have given you a prescription for an antibiotic Keflex which you should take as prescribed in its entirety.  I have also given you a few doses of narcotic pain medication that you should take for severe pain only.  Do not drive or operate heavy machinery while taking this medication as it can be sedating.  Return if development of any new or worsening symptoms.

## 2022-12-29 NOTE — Discharge Instructions (Addendum)
Please go to the emergency department for further evaluation.  You may need to be seen by hand surgeon to repair the laceration.

## 2022-12-29 NOTE — ED Triage Notes (Signed)
Pt has laceration to R thumb from yesterday, came yesterday and LWBS, last tetanus unknown.

## 2022-12-29 NOTE — ED Triage Notes (Signed)
Pt presents to the office for laceration to R thumb finger. Pt stated she was seen at the ED but left AMA last night.

## 2022-12-29 NOTE — ED Provider Notes (Signed)
[facility]    CSN: [id number] Arrival date & time: 12/29/22  1553      History   Chief Complaint Chief Complaint  Patient presents with   Laceration    HPI Courtney Hansen is a 47 y.o. female comes to the urgent care with right thumb laceration.  Laceration occurred yesterday afternoon.  Patient went to the emergency department but left before care was given.  Laceration was not repaired.  Patient comes to the urgent care for laceration repair.  According to the patient the laceration bled a lot.  Currently bleeding is controlled.Marland Kitchen   HPI  Past Medical History:  Diagnosis Date   Asthma    Depression    PTSD (post-traumatic stress disorder)     Patient Active Problem List   Diagnosis Date Noted   Severe recurrent major depression without psychotic features 08/15/2019   MDD (major depressive disorder), recurrent episode, severe 08/03/2018   Alcohol use disorder, severe, dependence 06/10/2017   Clonidine overdose 04/28/2017   Alcohol use 04/28/2017   Intentional drug overdose    Severe episode of recurrent major depressive disorder, without psychotic features     History reviewed. No pertinent surgical history.  OB History   No obstetric history on file.      Home Medications    Prior to Admission medications   Medication Sig Start Date End Date Taking? Authorizing Provider  albuterol (VENTOLIN HFA) 108 (90 Base) MCG/ACT inhaler Inhale 1-2 puffs into the lungs every 6 (six) hours as needed for wheezing or shortness of breath.    [provider]  fluticasone (FLONASE) 50 MCG/ACT nasal spray Place 1 spray into both nostrils daily. 11/25/22   [name], NP  gabapentin (NEURONTIN) 100 MG capsule Take 300 mg by mouth 3 (three) times daily. For agitation/pain management Patient not taking: Reported on 03/04/2022    [provider]  hydrOXYzine (VISTARIL) 25 MG capsule Take 25 mg by mouth daily as needed for anxiety.    [provider]  mirtazapine (REMERON) 15 MG tablet Take 15 mg by mouth at bedtime. Patient not taking: Reported on 03/04/2022    [provider]  predniSONE (DELTASONE) 20 MG tablet Take 60 mg daily x 2 days then 40 mg daily x 2 days then 20 mg daily x 2 days 10/25/22   [name], MD  promethazine-dextromethorphan (PROMETHAZINE-DM) 6.25-15 MG/5ML syrup Take 5 mLs by mouth 4 (four) times daily as needed for cough. 11/25/22   [name], NP    Family History Family History  Problem Relation Age of Onset   Schizophrenia Brother    Depression Brother     Social History Social History   Tobacco Use   Smoking status: Every Day    Packs/day: 1    Types: Cigarettes   Smokeless tobacco: Never   Tobacco comments:    Pt declined counsel  Vaping Use   Vaping Use: Never used  Substance Use Topics   Alcohol use: Yes    Comment: occasional drinker   Drug use: Yes    Types: Marijuana, Methamphetamines     Allergies   Patient has no known allergies.   Review of Systems Review of Systems As per HPI  Physical Exam Triage Vital Signs ED Triage Vitals [12/29/22 1721]  Enc Vitals Group     BP 121/85     Pulse Rate 82     Resp 18     Temp 98.1 F (36.7 C)  Temp Source Oral     SpO2 98 %     Weight      Height      Head Circumference      Peak Flow      Pain Score      Pain Loc      Pain Edu?      Excl. in GC?    No data found.  Updated Vital Signs BP 121/85 (BP Location: Left Arm)   Pulse 82   Temp 98.1 F (36.7 C) (Oral)   Resp 18   SpO2 98%   Visual Acuity Right Eye Distance:   Left Eye Distance:   Bilateral Distance:    Right Eye Near:   Left Eye Near:    Bilateral Near:     Physical Exam Musculoskeletal:     Comments: Laceration on the palmar surface of the right thumb.  Patient is unable to flex the distal phalanx of the right thumb.  Laceration is deep and measures about 1-1/2 inches long.      UC Treatments / Results  Labs (all labs  ordered are listed, but only abnormal results are displayed) Labs Reviewed - No data to display  EKG   Radiology No results found.  Procedures Procedures (including critical care time)  Medications Ordered in UC Medications - No data to display  Initial Impression / Assessment and Plan / UC Course  I have reviewed the triage vital signs and the nursing notes.  Pertinent labs & imaging results that were available during my care of the patient were reviewed by me and considered in my medical decision making (see chart for details).     1.  Laceration of the right arm with loss of function of the right thumb: Patient is advised to go to the emergency department for further evaluation and laceration repair Final Clinical Impressions(s) / UC Diagnoses   Final diagnoses:  Laceration of right thumb without foreign body without damage to nail, initial encounter     Discharge Instructions      Please go to the emergency department for further evaluation.  You may need to be seen by hand surgeon to repair the laceration.    ED Prescriptions   None    PDMP not reviewed this encounter.   Merrilee Jansky, MD 12/29/22 [phone]

## 2022-12-30 ENCOUNTER — Other Ambulatory Visit (HOSPITAL_COMMUNITY): Payer: Self-pay

## 2022-12-30 NOTE — Progress Notes (Signed)
Orthopedic Tech Progress Note Patient Details:  Courtney Hansen 04-Sep-1976 [id number]  Ortho Devices Type of Ortho Device: Thumb velcro splint Ortho Device/Splint Location: rue Ortho Device/Splint Interventions: Ordered, Application, Adjustment   Post Interventions Patient Tolerated: Well Instructions Provided: Care of device, Adjustment of device  [name] 12/30/2022, 7:24 AM

## 2023-09-21 ENCOUNTER — Emergency Department (HOSPITAL_COMMUNITY)
Admission: EM | Admit: 2023-09-21 | Discharge: 2023-09-22 | Discharge status: Against Medical Advice | Payer: Self-pay | Attending: Emergency Medicine | Admitting: Emergency Medicine

## 2023-09-21 ENCOUNTER — Emergency Department (HOSPITAL_COMMUNITY): Payer: Self-pay

## 2023-09-21 ENCOUNTER — Encounter (HOSPITAL_COMMUNITY): Payer: Self-pay

## 2023-09-21 ENCOUNTER — Other Ambulatory Visit: Payer: Self-pay

## 2023-09-21 DIAGNOSIS — Z5321 Procedure and treatment not carried out due to patient leaving prior to being seen by health care provider: Secondary | ICD-10-CM | POA: Insufficient documentation

## 2023-09-21 DIAGNOSIS — S0011XA Contusion of right eyelid and periocular area, initial encounter: Secondary | ICD-10-CM | POA: Insufficient documentation

## 2023-09-21 DIAGNOSIS — M79644 Pain in right finger(s): Secondary | ICD-10-CM | POA: Insufficient documentation
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
## 2023-09-28 ENCOUNTER — Telehealth: Payer: Self-pay | Admitting: Nurse Practitioner

## 2023-09-28 DIAGNOSIS — S62642A Nondisplaced fracture of proximal phalanx of right middle finger, initial encounter for closed fracture: Secondary | ICD-10-CM

## 2023-09-28 NOTE — Progress Notes (Signed)
Virtual Visit Consent   Courtney Hansen, you are scheduled for a virtual visit with a [facility] provider today. Just as with appointments in the office, your consent must be obtained to participate. Your consent will be active for this visit and any virtual visit you may have with one of our providers in the next 365 days. If you have a MyChart account, a copy of this consent can be sent to you electronically.  As this is a virtual visit, video technology does not allow for your provider to perform a traditional examination. This may limit your provider's ability to fully assess your condition. If your provider identifies any concerns that need to be evaluated in person or the need to arrange testing (such as labs, EKG, etc.), we will make arrangements to do so. Although advances in technology are sophisticated, we cannot ensure that it will always work on either your end or our end. If the connection with a video visit is poor, the visit may have to be switched to a telephone visit. With either a video or telephone visit, we are not always able to ensure that we have a secure connection.  By engaging in this virtual visit, you consent to the provision of healthcare and authorize for your insurance to be billed (if applicable) for the services provided during this visit. Depending on your insurance coverage, you may receive a charge related to this service.  I need to obtain your verbal consent now. Are you willing to proceed with your visit today? AZARI DRUSCHEL has provided verbal consent on 09/28/2023 for a virtual visit (video or telephone). Viviano Simas, FNP  Date: 09/28/2023 5:39 PM  Virtual Visit via Video Note   I, Viviano Simas, connected with  Courtney Hansen  ([id number], 1976/06/21) on 09/28/23 at  5:45 PM EST by a video-enabled telemedicine application and verified that I am speaking with the correct person using two identifiers.  Location: Patient: Virtual Visit Location Patient:  Home Provider: Virtual Visit Location Provider: Home Office   I discussed the limitations of evaluation and management by telemedicine and the availability of in person appointments. The patient expressed understanding and agreed to proceed.    History of Present Illness: Courtney Hansen is a 48 y.o. who identifies as a female who was assigned female at birth, and is being seen today for concerns over an injury to her finger.   She was in the ED last week (09/21/23)with concerns over breaking her finger. She ended up leaving prior to being treated   She bought a finger splint and starting wearing it today   Right middle finger  She has been icing her finger and taking ibuprofen  Bruising has improved  Denies any numbness or tingling to finger   She did have imaging completed prior to leaving ED   IMPRESSION: (09/21/23)  Acute, transverse, minimally comminuted fracture of the proximal metaphysis of the proximal phalanx of the third finger with mild volar apex angulation.   Problems:  Patient Active Problem List   Diagnosis Date Noted   Severe recurrent major depression without psychotic features (HCC) 08/15/2019   MDD (major depressive disorder), recurrent episode, severe (HCC) 08/03/2018   Alcohol use disorder, severe, dependence (HCC) 06/10/2017   Clonidine overdose 04/28/2017   Alcohol use 04/28/2017   Intentional drug overdose (HCC)    Severe episode of recurrent major depressive disorder, without psychotic features (HCC)     Allergies: No Known Allergies Medications:  Current Outpatient Medications:  albuterol (VENTOLIN HFA) 108 (90 Base) MCG/ACT inhaler, Inhale 1-2 puffs into the lungs every 6 (six) hours as needed for wheezing or shortness of breath., Disp: , Rfl:    cephALEXin (KEFLEX) 500 MG capsule, Take 1 capsule (500 mg total) by mouth 4 (four) times daily., Disp: 20 capsule, Rfl: 0   fluticasone (FLONASE) 50 MCG/ACT nasal spray, Place 1 spray into both nostrils  daily., Disp: 15.8 mL, Rfl: 0   gabapentin (NEURONTIN) 100 MG capsule, Take 300 mg by mouth 3 (three) times daily. For agitation/pain management (Patient not taking: Reported on 03/04/2022), Disp: , Rfl:    hydrOXYzine (VISTARIL) 25 MG capsule, Take 25 mg by mouth daily as needed for anxiety., Disp: , Rfl:    mirtazapine (REMERON) 15 MG tablet, Take 15 mg by mouth at bedtime. (Patient not taking: Reported on 03/04/2022), Disp: , Rfl:    predniSONE (DELTASONE) 20 MG tablet, Take 60 mg daily x 2 days then 40 mg daily x 2 days then 20 mg daily x 2 days, Disp: 12 tablet, Rfl: 0   promethazine-dextromethorphan (PROMETHAZINE-DM) 6.25-15 MG/5ML syrup, Take 5 mLs by mouth 4 (four) times daily as needed for cough., Disp: 118 mL, Rfl: 0  Observations/Objective: Patient is well-developed, well-nourished in no acute distress.  Resting comfortably  at home.  Head is normocephalic, atraumatic.  No labored breathing. Speech is clear and coherent with logical content.  Patient is alert and oriented at baseline.    Assessment and Plan:  1. Closed nondisplaced fracture of proximal phalanx of right middle finger, initial encounter (Primary)   Advised return to ED or [facility] hours for repeat imaging and evaluation  She has had injury for one week now without intervention- explained the need for repeat imaging and evaluation prior to a plan for stabilization   She is agreeable to seek in person follow up care      Follow Up Instructions: I discussed the assessment and treatment plan with the patient. The patient was provided an opportunity to ask questions and all were answered. The patient agreed with the plan and demonstrated an understanding of the instructions.  A copy of instructions were sent to the patient via MyChart unless otherwise noted below.    The patient was advised to call back or seek an in-person evaluation if the symptoms worsen or if the condition fails to improve as  anticipated.    Viviano Simas, FNP
